# Patient Record
Sex: Female | Born: 1965
Health system: Southern US, Community
[De-identification: ages and names within clinical notes are randomized; demographics above are authoritative.]

## PROBLEM LIST (undated history)

## (undated) DIAGNOSIS — R221 Localized swelling, mass and lump, neck: Secondary | ICD-10-CM

## (undated) DIAGNOSIS — R220 Localized swelling, mass and lump, head: Principal | ICD-10-CM

## (undated) DIAGNOSIS — I10 Essential (primary) hypertension: Secondary | ICD-10-CM

## (undated) DIAGNOSIS — D219 Benign neoplasm of connective and other soft tissue, unspecified: Secondary | ICD-10-CM

## (undated) DIAGNOSIS — D509 Iron deficiency anemia, unspecified: Secondary | ICD-10-CM

## (undated) HISTORY — DX: Localized swelling, mass and lump, head: R22.0

## (undated) HISTORY — PX: WISDOM TOOTH EXTRACTION: SHX21

## (undated) HISTORY — DX: Iron deficiency anemia, unspecified: D50.9

## (undated) HISTORY — DX: Benign neoplasm of connective and other soft tissue, unspecified: D21.9

## (undated) HISTORY — PX: TUBAL LIGATION: SHX77

## (undated) HISTORY — DX: Essential (primary) hypertension: I10

---

## 1985-10-15 HISTORY — PX: OTHER SURGICAL HISTORY: SHX169

## 1999-01-27 ENCOUNTER — Other Ambulatory Visit: Admission: RE | Admit: 1999-01-27 | Discharge: 1999-01-27 | Payer: Self-pay | Admitting: Obstetrics & Gynecology

## 1999-08-10 ENCOUNTER — Encounter (INDEPENDENT_AMBULATORY_CARE_PROVIDER_SITE_OTHER): Payer: Self-pay

## 1999-08-10 ENCOUNTER — Encounter (INDEPENDENT_AMBULATORY_CARE_PROVIDER_SITE_OTHER): Payer: Self-pay | Admitting: Specialist

## 1999-08-10 ENCOUNTER — Inpatient Hospital Stay (HOSPITAL_COMMUNITY): Admission: RE | Admit: 1999-08-10 | Discharge: 1999-08-13 | Payer: Self-pay | Admitting: Obstetrics & Gynecology

## 2001-05-26 ENCOUNTER — Other Ambulatory Visit: Admission: RE | Admit: 2001-05-26 | Discharge: 2001-05-26 | Payer: Self-pay | Admitting: Obstetrics and Gynecology

## 2004-01-17 ENCOUNTER — Emergency Department (HOSPITAL_COMMUNITY): Admission: AD | Admit: 2004-01-17 | Discharge: 2004-01-17 | Payer: Self-pay | Admitting: Family Medicine

## 2004-02-05 ENCOUNTER — Ambulatory Visit (HOSPITAL_COMMUNITY): Admission: RE | Admit: 2004-02-05 | Discharge: 2004-02-05 | Payer: Self-pay | Admitting: Family Medicine

## 2004-08-03 ENCOUNTER — Encounter: Admission: RE | Admit: 2004-08-03 | Discharge: 2004-08-03 | Payer: Self-pay | Admitting: Occupational Medicine

## 2005-04-13 HISTORY — PX: TOTAL ABDOMINAL HYSTERECTOMY: SHX209

## 2007-12-25 ENCOUNTER — Ambulatory Visit (HOSPITAL_BASED_OUTPATIENT_CLINIC_OR_DEPARTMENT_OTHER): Admission: RE | Admit: 2007-12-25 | Discharge: 2007-12-25 | Payer: Self-pay | Admitting: Orthopedic Surgery

## 2010-11-05 ENCOUNTER — Encounter: Payer: Self-pay | Admitting: Obstetrics and Gynecology

## 2010-11-16 ENCOUNTER — Emergency Department (HOSPITAL_COMMUNITY)
Admission: EM | Admit: 2010-11-16 | Discharge: 2010-11-16 | Disposition: A | Discharge status: Home / Self Care | Payer: BC Managed Care – PPO | Attending: Emergency Medicine | Admitting: Emergency Medicine

## 2010-11-16 DIAGNOSIS — M542 Cervicalgia: Secondary | ICD-10-CM | POA: Insufficient documentation

## 2010-11-16 DIAGNOSIS — M545 Low back pain, unspecified: Secondary | ICD-10-CM | POA: Insufficient documentation

## 2010-11-16 DIAGNOSIS — R51 Headache: Secondary | ICD-10-CM | POA: Insufficient documentation

## 2011-02-27 NOTE — Op Note (Signed)
[name], [name]               ACCOUNT NO.:  [id number]   MEDICAL RECORD NO.:  [id number]          PATIENT TYPE:  AMB   LOCATION:  DSC                          FACILITY:  [facility]   PHYSICIAN:  Katy Fitch. Sypher, M.D. DATE OF BIRTH:  Mar 05, 1966   DATE OF PROCEDURE:  12/25/2007  DATE OF DISCHARGE:                               OPERATIVE REPORT   PREOPERATIVE DIAGNOSIS:  Laceration extensor tendon left ring finger  with 60 degrees extensor lag.   POSTOPERATIVE DIAGNOSIS:  Avulsion of extensor tendon from dorsal  tubercle of ring finger distal phalanx and intra-articular laceration of  distal interphalangeal joint, left ring finger.   OPERATION:  1. Irrigation and debridement of open left ring finger DIP joint.  2. A 0.035-inch Kirschner wire fixation of left ring finger DIP joint      in 10 degrees hyperextension.  3. Reconstruction of extensor mechanism utilizing 4-0 M[place]il[place]      grasping suture and 4-0 nylon stay sutures through dermis and      epidermis distally.   OPERATING SURGEON:  Josephine Igo, M.D.   ASSISTANT:  Annye Rusk, P.A.-C.   ANESTHESIA:  Is general by LMA; supervising anesthesiologist is Dr.  Krista Blue.   INDICATIONS:  [name] is a 41-year woman referred through the  courtesy of the urgent medical care center.  Two days prior, she had  lacerated the dorsum of her left ring finger with a carving knife.   She was seen at the [facility]gent [facility] where her wound was cleaned and  sutured.  She subsequently developed a 60-degree extensor lag.  She  return for evaluation on December 24, 2007 and was advised to seek a hand  surgery consult.   She contacted our office this morning and was seen on urgent basis at  the [facility] day surgery center for consultation.   At that time, she reported the injury and the development of her  extensor lag.   She had been on Keflex 5 mg 1 p.o. q.6h. as a prophylactic antibiotic.   Past medical history was reviewed in  detail.  She was noted be  intolerant of oxycodone (itching).   Her past medical history was reviewed.   HER DRUG ALLERGIES INCLUDED INTOLERANCE TO OXYCODONE.   She was on lisinopril for elevated blood pressure.   Her prior surgery history revealed a hysterectomy in 2000.  Her family  history was otherwise noncontributory.  She is accompanied by her  husband during our consult.   She reported that she had no history of significant pulmonary,  cardiovascular, neurological, hematologic, endocrine, GI, or GU  symptomatology.  Her reproductive history revealed that she was  postmenopausal status post hysterectomy.  She had no skin problems and  no sleep impairment.   We advised her that we would proceed with irrigation debridement of her  joint, K-wire fixation of her DIP joint and extension, followed by  repair of her extensor tendon.  She understood that the pin would need  to be in place for approximately six weeks.  She also understood that  she would be stiff for  a period time but should be able to recover the  majority of her DIP motion with therapy.   After informed consent, she was brought to the operating room at this  time.   PROCEDURE:  [name] was brought to the operating room and placed  in supine position on the operating table.   Following an anesthesia consult by Dr. Krista Blue, general anesthesia by LMA  technique was recommended and accepted.   She was brought to Room 1, placed in supine position on the operating  room table, and general anesthesia by LMA technique induced.   The left arm was then prepped with Betadine soap solution and sterilely  draped.  A pneumatic tourniquet was applied to the proximal left  brachium.  One gram of Ancef was administered as an IV prophylactic  antibiotic followed by exsanguination of the left arm with Esmarch  bandage and inflation of the arterial tourniquet to 220 mmHg.  The  procedure commenced with removal of the  sutures and irrigation of the  wound.  The PIP joint was violated.  The joint was thoroughly irrigated  with sterile saline.  The extensor tendon was mobilized.  The PIP joint  was placed in 10 degrees hyperextension, and a 0.035-inch Kirschner wire  was drilled across the distal phalanx and the phalangeal head, securing  the joint in this position.   K-wire position was confirmed by C-arm images.   The extensor was then repaired with a grasping box-style mattress suture  anatomically followed by reinforcement with transdermal stay sutures  brought out radially and ulnarly along the diaphysis of the distal  phalanx.  An anatomic repair to the tubercle was achieved.   The K-wire was prepared in the usual manner.  The skin was repaired with  trauma sutures of 4-0 nylon.  The finger was then dressed with Xeroflo  sterile gauze and Coban with Alumafoam splint.  [name] tolerated the  surgery and anesthesia well.   For aftercare, she was provided a prescription for Keflex 5 mg 1 p.o.  q.6h. p.r.n. pain that she will continue from the Urgent Care Center.  She will also use Vicodin 5 mg 1 p.o. q4-6h. p.r.n. pain.  She was  provided 20 tablets without refill.  We will see her back in follow-up  in one week to change her dressing, obtain an x-ray, and fashion a  splint to project her Kirschner wire construct.      Katy Fitch Sypher, M.D.  Electronically Signed     RVS/MEDQ  D:  12/25/2007  T:  12/26/2007  Job:  [id number]

## 2011-05-10 ENCOUNTER — Emergency Department (HOSPITAL_COMMUNITY)
Admission: EM | Admit: 2011-05-10 | Discharge: 2011-05-10 | Disposition: A | Discharge status: Home / Self Care | Payer: BC Managed Care – PPO | Attending: Emergency Medicine | Admitting: Emergency Medicine

## 2011-05-10 DIAGNOSIS — F3289 Other specified depressive episodes: Secondary | ICD-10-CM | POA: Insufficient documentation

## 2011-05-10 DIAGNOSIS — F329 Major depressive disorder, single episode, unspecified: Secondary | ICD-10-CM | POA: Insufficient documentation

## 2011-05-10 LAB — URINALYSIS, ROUTINE W REFLEX MICROSCOPIC
Bilirubin Urine: NEGATIVE
Glucose, UA: NEGATIVE mg/dL
Ketones, ur: NEGATIVE mg/dL
Leukocytes, UA: NEGATIVE
Nitrite: NEGATIVE
Protein, ur: 100 mg/dL — AB
Specific Gravity, Urine: 1.029 (ref 1.005–1.030)
Urobilinogen, UA: 0.2 mg/dL (ref 0.0–1.0)
pH: 5 (ref 5.0–8.0)

## 2011-05-10 LAB — DIFFERENTIAL
Basophils Absolute: 0 10*3/uL (ref 0.0–0.1)
Basophils Relative: 0 % (ref 0–1)
Eosinophils Absolute: 0.1 10*3/uL (ref 0.0–0.7)
Eosinophils Relative: 1 % (ref 0–5)
Lymphocytes Relative: 20 % (ref 12–46)
Lymphs Abs: 2.2 10*3/uL (ref 0.7–4.0)
Monocytes Absolute: 0.6 10*3/uL (ref 0.1–1.0)
Monocytes Relative: 6 % (ref 3–12)
Neutro Abs: 8.1 10*3/uL — ABNORMAL HIGH (ref 1.7–7.7)
Neutrophils Relative %: 73 % (ref 43–77)

## 2011-05-10 LAB — COMPREHENSIVE METABOLIC PANEL
ALT: 21 U/L (ref 0–35)
AST: 21 U/L (ref 0–37)
Albumin: 3.9 g/dL (ref 3.5–5.2)
Alkaline Phosphatase: 87 U/L (ref 39–117)
BUN: 12 mg/dL (ref 6–23)
CO2: 24 mEq/L (ref 19–32)
Calcium: 10.3 mg/dL (ref 8.4–10.5)
Chloride: 102 mEq/L (ref 96–112)
Creatinine, Ser: 1.03 mg/dL (ref 0.50–1.10)
GFR calc Af Amer: >60 mL/min (ref >60)
GFR calc non Af Amer: 58 mL/min — ABNORMAL LOW (ref >60)
Glucose, Bld: 108 mg/dL — ABNORMAL HIGH (ref 70–99)
Potassium: 3.3 mEq/L — ABNORMAL LOW (ref 3.5–5.1)
Sodium: 135 mEq/L (ref 135–145)
Total Bilirubin: 0.4 mg/dL (ref 0.3–1.2)
Total Protein: 8.5 g/dL — ABNORMAL HIGH (ref 6.0–8.3)

## 2011-05-10 LAB — CBC
HCT: 37 % (ref 36.0–46.0)
Hemoglobin: 12 g/dL (ref 12.0–15.0)
MCH: 27.2 pg (ref 26.0–34.0)
MCHC: 32.4 g/dL (ref 30.0–36.0)
MCV: 83.9 fL (ref 78.0–100.0)
Platelets: 378 10*3/uL (ref 150–400)
RBC: 4.41 MIL/uL (ref 3.87–5.11)
RDW: 13.8 % (ref 11.5–15.5)
WBC: 11.1 10*3/uL — ABNORMAL HIGH (ref 4.0–10.5)

## 2011-05-10 LAB — URINE MICROSCOPIC-ADD ON

## 2011-05-10 LAB — RAPID URINE DRUG SCREEN, HOSP PERFORMED
Amphetamines: NOT DETECTED
Barbiturates: NOT DETECTED
Benzodiazepines: NOT DETECTED
Cocaine: NOT DETECTED
Opiates: NOT DETECTED
Tetrahydrocannabinol: NOT DETECTED

## 2011-05-10 LAB — ETHANOL: Alcohol, Ethyl (B): <11 mg/dL (ref 0–11)

## 2011-05-15 ENCOUNTER — Other Ambulatory Visit (HOSPITAL_COMMUNITY): Payer: BC Managed Care – PPO | Attending: Psychiatry | Admitting: Psychiatry

## 2011-05-15 DIAGNOSIS — F3289 Other specified depressive episodes: Secondary | ICD-10-CM | POA: Insufficient documentation

## 2011-05-15 DIAGNOSIS — F329 Major depressive disorder, single episode, unspecified: Secondary | ICD-10-CM | POA: Insufficient documentation

## 2011-05-15 DIAGNOSIS — I10 Essential (primary) hypertension: Secondary | ICD-10-CM | POA: Insufficient documentation

## 2011-05-16 ENCOUNTER — Encounter (HOSPITAL_COMMUNITY): Payer: BC Managed Care – PPO | Attending: Psychiatry | Admitting: Psychiatry

## 2011-05-16 DIAGNOSIS — I10 Essential (primary) hypertension: Secondary | ICD-10-CM | POA: Insufficient documentation

## 2011-05-16 DIAGNOSIS — F329 Major depressive disorder, single episode, unspecified: Secondary | ICD-10-CM | POA: Insufficient documentation

## 2011-05-16 DIAGNOSIS — F3289 Other specified depressive episodes: Secondary | ICD-10-CM | POA: Insufficient documentation

## 2011-05-16 DIAGNOSIS — F4323 Adjustment disorder with mixed anxiety and depressed mood: Secondary | ICD-10-CM

## 2011-05-17 ENCOUNTER — Encounter (HOSPITAL_COMMUNITY): Payer: BC Managed Care – PPO | Admitting: Psychiatry

## 2011-05-18 ENCOUNTER — Encounter (HOSPITAL_COMMUNITY): Payer: BC Managed Care – PPO | Admitting: Psychiatry

## 2011-05-21 ENCOUNTER — Encounter (HOSPITAL_COMMUNITY): Payer: BC Managed Care – PPO | Admitting: Psychiatry

## 2011-05-22 ENCOUNTER — Encounter (HOSPITAL_COMMUNITY): Payer: BC Managed Care – PPO | Admitting: Psychiatry

## 2011-05-23 ENCOUNTER — Encounter (HOSPITAL_COMMUNITY): Payer: BC Managed Care – PPO | Admitting: Psychiatry

## 2011-05-24 ENCOUNTER — Encounter (HOSPITAL_COMMUNITY): Payer: BC Managed Care – PPO | Admitting: Psychiatry

## 2011-05-25 ENCOUNTER — Encounter (HOSPITAL_COMMUNITY): Payer: BC Managed Care – PPO | Admitting: Psychiatry

## 2011-05-28 ENCOUNTER — Encounter (HOSPITAL_COMMUNITY): Payer: BC Managed Care – PPO | Admitting: Psychiatry

## 2011-05-29 ENCOUNTER — Encounter (HOSPITAL_COMMUNITY): Payer: BC Managed Care – PPO | Admitting: Psychiatry

## 2011-05-30 ENCOUNTER — Encounter (HOSPITAL_COMMUNITY): Payer: BC Managed Care – PPO | Admitting: Psychiatry

## 2011-05-31 ENCOUNTER — Encounter (HOSPITAL_COMMUNITY): Payer: BC Managed Care – PPO | Admitting: Psychiatry

## 2011-06-01 ENCOUNTER — Encounter (HOSPITAL_COMMUNITY): Payer: BC Managed Care – PPO | Admitting: Psychiatry

## 2011-06-04 ENCOUNTER — Encounter (HOSPITAL_COMMUNITY): Payer: BC Managed Care – PPO | Admitting: Psychiatry

## 2011-06-05 ENCOUNTER — Encounter (HOSPITAL_COMMUNITY): Payer: BC Managed Care – PPO | Admitting: Psychiatry

## 2011-06-06 ENCOUNTER — Encounter (HOSPITAL_COMMUNITY): Payer: BC Managed Care – PPO | Admitting: Psychiatry

## 2011-06-07 ENCOUNTER — Encounter (HOSPITAL_COMMUNITY): Payer: BC Managed Care – PPO | Admitting: Psychiatry

## 2011-06-08 ENCOUNTER — Encounter (HOSPITAL_COMMUNITY): Payer: BC Managed Care – PPO | Admitting: Psychiatry

## 2011-06-11 ENCOUNTER — Encounter (HOSPITAL_COMMUNITY): Payer: BC Managed Care – PPO | Admitting: Psychiatry

## 2011-06-12 ENCOUNTER — Encounter (HOSPITAL_COMMUNITY): Payer: BC Managed Care – PPO | Admitting: Psychiatry

## 2011-06-12 ENCOUNTER — Ambulatory Visit (HOSPITAL_COMMUNITY): Payer: BC Managed Care – PPO | Admitting: Physician Assistant

## 2011-07-09 LAB — POCT I-STAT, CHEM 8
BUN: 9
Calcium, Ion: 1.13
Chloride: 105
Creatinine, Ser: 0.9
Glucose, Bld: 99
HCT: 40
Hemoglobin: 13.6
Potassium: 4
Sodium: 141
TCO2: 24

## 2012-02-19 ENCOUNTER — Ambulatory Visit (INDEPENDENT_AMBULATORY_CARE_PROVIDER_SITE_OTHER): Payer: BC Managed Care – PPO | Admitting: General Surgery

## 2012-02-21 ENCOUNTER — Encounter (INDEPENDENT_AMBULATORY_CARE_PROVIDER_SITE_OTHER): Payer: Self-pay | Admitting: Surgery

## 2012-02-25 ENCOUNTER — Encounter (INDEPENDENT_AMBULATORY_CARE_PROVIDER_SITE_OTHER): Payer: Self-pay | Admitting: Surgery

## 2012-02-25 ENCOUNTER — Ambulatory Visit (INDEPENDENT_AMBULATORY_CARE_PROVIDER_SITE_OTHER): Payer: BC Managed Care – PPO | Admitting: Surgery

## 2012-02-25 VITALS — BP 132/98 | HR 68 | Temp 97.5°F | Ht 61.0 in | Wt 162.0 lb

## 2012-02-25 DIAGNOSIS — R220 Localized swelling, mass and lump, head: Secondary | ICD-10-CM | POA: Insufficient documentation

## 2012-02-25 DIAGNOSIS — R229 Localized swelling, mass and lump, unspecified: Secondary | ICD-10-CM

## 2012-02-25 HISTORY — DX: Localized swelling, mass and lump, head: R22.0

## 2012-02-25 NOTE — Progress Notes (Signed)
Patient ID: [name], female   DOB: 09-12-1966, 46 y.o.   MRN: [id number]  Chief Complaint  Patient presents with  . Other    scalp mass    HPI [name] is a 46 y.o. female. This is a very pleasant female referred by Dr. Knox Royalty [facility] evaluation of a posterior scalp mass. The patient has noticed a mass for approximately a year. It is now getting larger, causing her to have tension headaches, and posterior have neck and shoulder pain. The pain is described as an ache. It is getting worse. She is otherwise without complaints HPI  Past Medical History  Diagnosis Date  . Hypertension   . Hyperlipidemia     Past Surgical History  Procedure Date  . Cervix cerclage 1987    Family History  Problem Relation Age of Onset  . Heart disease Father     Social History History  Substance Use Topics  . Smoking status: Current Some Day Smoker    Types: Cigars  . Smokeless tobacco: Not on file  . Alcohol Use: No    Allergies  Allergen Reactions  . Tylox (Oxycodone-Acetaminophen)     Current Outpatient Prescriptions  Medication Sig Dispense Refill  . benzonatate (TESSALON) 200 MG capsule       . lisinopril-hydrochlorothiazide (PRINZIDE,ZESTORETIC) 20-12.5 MG per tablet         Review of Systems Review of Systems  Constitutional: Negative for fever, chills and unexpected weight change.  HENT: Positive for neck stiffness. Negative for hearing loss, congestion, sore throat, trouble swallowing and voice change.   Eyes: Negative for visual disturbance.  Respiratory: Negative for cough and wheezing.   Cardiovascular: Negative for chest pain, palpitations and leg swelling.  Gastrointestinal: Negative for nausea, vomiting, abdominal pain, diarrhea, constipation, blood in stool, abdominal distention and anal bleeding.  Genitourinary: Negative for hematuria, vaginal bleeding and difficulty urinating.  Musculoskeletal: Negative for  arthralgias.  Skin: Negative for rash and wound.  Neurological: Positive for headaches. Negative for seizures and syncope.  Hematological: Negative for adenopathy. Does not bruise/bleed easily.  Psychiatric/Behavioral: Negative for confusion.    Blood pressure 132/98, pulse 68, temperature 97.5 F (36.4 C), temperature source Temporal, height 5\' 1"  (1.549 m), weight 162 lb (73.483 kg), SpO2 98.00%.  Physical Exam Physical Exam  Constitutional: She is oriented to person, place, and time. She appears well-developed and well-nourished. No distress.  HENT:  Head: Normocephalic and atraumatic.  Right Ear: External ear normal.  Left Ear: External ear normal.  Nose: Nose normal.  Mouth/Throat: Oropharynx is clear and moist. No oropharyngeal exudate.       There is a 3 cm mass posteriorly at the base of her scalp. It is soft and minimally mobile. It is mildly tender. There is no erythema  Eyes: Conjunctivae and EOM are normal. Pupils are equal, round, and reactive to light. No scleral icterus.  Neck: Normal range of motion. Neck supple. No tracheal deviation present. No thyromegaly present.  Cardiovascular: Normal rate, regular rhythm, normal heart sounds and intact distal pulses.   No murmur heard. Pulmonary/Chest: Effort normal and breath sounds normal. No respiratory distress. She has no wheezes.  Musculoskeletal: Normal range of motion. She exhibits no edema and no tenderness.  Lymphadenopathy:    She has no cervical adenopathy.  Neurological: She is alert and oriented to person, place, and time.  Skin: Skin is warm and dry. She is not diaphoretic. No erythema. No pallor.  Psychiatric: Her behavior is normal. Judgment normal.    Data Reviewed   Assessment    Posterior scalp mass of uncertain etiology    Plan    This may represent a lipoma. Removal of it is recommended given her symptoms and for histologic evaluation to rule out malignancy. I discussed this with her in detail. I  discussed the risk of surgery which includes but is not limited to bleeding, infection, recurrence, need for further surgery, seroma formation, etc. She understands and wishes to proceed. Surgery will be scheduled. Likelihood of success is good       Mercedes Valeriano A 02/25/2012, 1:42 PM

## 2012-03-31 ENCOUNTER — Encounter (INDEPENDENT_AMBULATORY_CARE_PROVIDER_SITE_OTHER): Payer: BC Managed Care – PPO | Admitting: Surgery

## 2012-12-23 ENCOUNTER — Ambulatory Visit: Payer: Private Health Insurance - Indemnity | Admitting: Obstetrics and Gynecology

## 2012-12-23 ENCOUNTER — Encounter: Payer: Self-pay | Admitting: Obstetrics and Gynecology

## 2012-12-23 VITALS — BP 142/74 | Ht 62.0 in | Wt 149.0 lb

## 2012-12-23 DIAGNOSIS — A749 Chlamydial infection, unspecified: Secondary | ICD-10-CM | POA: Insufficient documentation

## 2012-12-23 DIAGNOSIS — L309 Dermatitis, unspecified: Secondary | ICD-10-CM

## 2012-12-23 DIAGNOSIS — Z01419 Encounter for gynecological examination (general) (routine) without abnormal findings: Secondary | ICD-10-CM

## 2012-12-23 MED ORDER — NYSTATIN-TRIAMCINOLONE 100000-0.1 UNIT/GM-% EX OINT: TOPICAL_OINTMENT | Freq: Three times a day (TID) | CUTANEOUS | Status: DC | PRN

## 2012-12-23 NOTE — Progress Notes (Signed)
The patient is not taking hormone replacement therapy The patient  is not taking a Calcium supplement. Post-menopausal bleeding:no  Last Pap: approximate date 12/2011 and was normal  Last mammogram: approximate date 2008 and was normal  Last DEXA scan : n/a Last colonoscopy:n/a  Urinary symptoms: none Normal bowel movements: Yes Reports abuse at home: No:   Subjective:    [name] is a 47 y.o. female G4P3, who presents for annual exam.  The patient complains of rash at vaginal area after shaving, worsens when sweating. Pt has used vasoline which has not helped.  The following portions of the patient's history were reviewed and updated as appropriate: allergies, current medications, past family history, past medical history, past social history, past surgical history and problem list.  Review of Systems Pertinent items are noted in HPI. Gastrointestinal:No change in bowel habits, no abdominal pain, no rectal bleeding Genitourinary:negative for dysuria, frequency, hematuria, nocturia and urinary incontinence    Objective:     BP 142/74  Ht 5\' 2"  (1.575 m)  Wt 149 lb (67.586 kg)  BMI 27.25 kg/m2  Wt Readings from Last 1 Encounters:  02/25/12 162 lb (73.483 kg)     BMI: Body mass index is 27.25 kg/(m^2). General Appearance: Alert, appropriate appearance for age. No acute distress HEENT: Grossly normal Neck / Thyroid: Supple, no masses, nodes or enlargement Lungs: clear to auscultation bilaterally Back: No CVA tenderness Breast Exam: No masses or nodes.No dimpling, nipple retraction or discharge. Cardiovascular: Regular rate and rhythm. S1, S2, no murmur Gastrointestinal: Soft, non-tender, no masses or organomegaly Pelvic Exam: Vulva with erythema c/w eczema and vagina appear normal. Bimanual exam reveals surgically absent uterus and adnexa. Rectovaginal: normal rectal, no masses Lymphatic Exam: Non-palpable nodes in neck, clavicular, axillary, or inguinal regions   Skin: no rash or abnormalities Neurologic: Normal gait and speech, no tremor  Psychiatric: Alert and oriented, appropriate affect.   Assessment:    Normal gyn exam  Vulvar eczema   Plan:   mammogram return annually or prn Schedule [facility] @ [facility]  Triamcinolone cream sent to pharmacy  Silverio Lay MD

## 2013-02-25 ENCOUNTER — Emergency Department (HOSPITAL_COMMUNITY): Payer: Managed Care, Other (non HMO)

## 2013-02-25 ENCOUNTER — Encounter (HOSPITAL_COMMUNITY): Payer: Self-pay

## 2013-02-25 ENCOUNTER — Emergency Department (HOSPITAL_COMMUNITY)
Admission: EM | Admit: 2013-02-25 | Discharge: 2013-02-25 | Disposition: A | Discharge status: Home / Self Care | Payer: Managed Care, Other (non HMO) | Attending: Emergency Medicine | Admitting: Emergency Medicine

## 2013-02-25 DIAGNOSIS — F17200 Nicotine dependence, unspecified, uncomplicated: Secondary | ICD-10-CM | POA: Insufficient documentation

## 2013-02-25 DIAGNOSIS — Z8679 Personal history of other diseases of the circulatory system: Secondary | ICD-10-CM | POA: Insufficient documentation

## 2013-02-25 DIAGNOSIS — I10 Essential (primary) hypertension: Secondary | ICD-10-CM | POA: Insufficient documentation

## 2013-02-25 DIAGNOSIS — Y9389 Activity, other specified: Secondary | ICD-10-CM | POA: Insufficient documentation

## 2013-02-25 DIAGNOSIS — Z862 Personal history of diseases of the blood and blood-forming organs and certain disorders involving the immune mechanism: Secondary | ICD-10-CM | POA: Insufficient documentation

## 2013-02-25 DIAGNOSIS — Z8619 Personal history of other infectious and parasitic diseases: Secondary | ICD-10-CM | POA: Insufficient documentation

## 2013-02-25 DIAGNOSIS — Z8742 Personal history of other diseases of the female genital tract: Secondary | ICD-10-CM | POA: Insufficient documentation

## 2013-02-25 DIAGNOSIS — E785 Hyperlipidemia, unspecified: Secondary | ICD-10-CM | POA: Insufficient documentation

## 2013-02-25 DIAGNOSIS — S060X9A Concussion with loss of consciousness of unspecified duration, initial encounter: Secondary | ICD-10-CM

## 2013-02-25 DIAGNOSIS — Y92410 Unspecified street and highway as the place of occurrence of the external cause: Secondary | ICD-10-CM | POA: Insufficient documentation

## 2013-02-25 DIAGNOSIS — R221 Localized swelling, mass and lump, neck: Secondary | ICD-10-CM | POA: Insufficient documentation

## 2013-02-25 DIAGNOSIS — R109 Unspecified abdominal pain: Secondary | ICD-10-CM | POA: Insufficient documentation

## 2013-02-25 DIAGNOSIS — Z79899 Other long term (current) drug therapy: Secondary | ICD-10-CM | POA: Insufficient documentation

## 2013-02-25 DIAGNOSIS — R220 Localized swelling, mass and lump, head: Secondary | ICD-10-CM | POA: Insufficient documentation

## 2013-02-25 LAB — CBC
HCT: 37.6 % (ref 36.0–46.0)
Hemoglobin: 12.4 g/dL (ref 12.0–15.0)
RBC: 4.47 MIL/uL (ref 3.87–5.11)

## 2013-02-25 LAB — POCT I-STAT, CHEM 8
BUN: 11 mg/dL (ref 6–23)
Calcium, Ion: 1.14 mmol/L (ref 1.12–1.23)
HCT: 41 % (ref 36.0–46.0)
Hemoglobin: 13.9 g/dL (ref 12.0–15.0)
TCO2: 23 mmol/L (ref 0–100)

## 2013-02-25 LAB — COMPREHENSIVE METABOLIC PANEL
ALT: 13 U/L (ref 0–35)
Alkaline Phosphatase: 72 U/L (ref 39–117)
BUN: 11 mg/dL (ref 6–23)
CO2: 23 mEq/L (ref 19–32)
Chloride: 102 mEq/L (ref 96–112)
GFR calc Af Amer: >90 mL/min (ref >90)
GFR calc non Af Amer: 82 mL/min — ABNORMAL LOW (ref >90)
Glucose, Bld: 99 mg/dL (ref 70–99)
Potassium: 3.8 mEq/L (ref 3.5–5.1)
Sodium: 134 mEq/L — ABNORMAL LOW (ref 135–145)
Total Bilirubin: 0.5 mg/dL (ref 0.3–1.2)

## 2013-02-25 MED ORDER — KETOROLAC TROMETHAMINE 30 MG/ML IJ SOLN
30.0000 mg | Freq: Once | INTRAMUSCULAR | Status: AC
  Administered 2013-02-25: 30 mg via INTRAVENOUS
  Filled 2013-02-25: qty 1

## 2013-02-25 MED ORDER — FENTANYL CITRATE 0.05 MG/ML IJ SOLN
50.0000 ug | Freq: Once | INTRAMUSCULAR | Status: AC
  Administered 2013-02-25: 50 ug via INTRAVENOUS
  Filled 2013-02-25 (×2): qty 2

## 2013-02-25 MED ORDER — OXYCODONE-ACETAMINOPHEN 5-325 MG PO TABS: 2.0000 | ORAL_TABLET | ORAL | Status: DC | PRN

## 2013-02-25 MED ORDER — SODIUM CHLORIDE 0.9 % IV SOLN
INTRAVENOUS | Status: DC
  Administered 2013-02-25: 05:00:00 via INTRAVENOUS

## 2013-02-25 MED ORDER — CYCLOBENZAPRINE HCL 10 MG PO TABS: 10.0000 mg | ORAL_TABLET | Freq: Two times a day (BID) | ORAL | Status: DC | PRN

## 2013-02-25 MED ORDER — IBUPROFEN 800 MG PO TABS: 800.0000 mg | ORAL_TABLET | Freq: Three times a day (TID) | ORAL | Status: DC

## 2013-02-25 MED ORDER — DIPHENHYDRAMINE HCL 25 MG PO CAPS: 25.0000 mg | ORAL_CAPSULE | Freq: Four times a day (QID) | ORAL | Status: DC | PRN

## 2013-02-25 MED ORDER — ONDANSETRON HCL 4 MG/2ML IJ SOLN
4.0000 mg | Freq: Once | INTRAMUSCULAR | Status: AC
  Administered 2013-02-25: 4 mg via INTRAVENOUS
  Filled 2013-02-25: qty 2

## 2013-02-25 MED ORDER — IOHEXOL 300 MG/ML  SOLN
100.0000 mL | Freq: Once | INTRAMUSCULAR | Status: AC | PRN
  Administered 2013-02-25: 100 mL via INTRAVENOUS
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
## 2013-12-30 DIAGNOSIS — I10 Essential (primary) hypertension: Secondary | ICD-10-CM | POA: Insufficient documentation

## 2014-05-30 ENCOUNTER — Encounter (HOSPITAL_COMMUNITY): Payer: Self-pay | Admitting: Emergency Medicine

## 2014-05-30 ENCOUNTER — Emergency Department (HOSPITAL_COMMUNITY)
Admission: EM | Admit: 2014-05-30 | Discharge: 2014-05-30 | Disposition: A | Discharge status: Home / Self Care | Payer: BC Managed Care – PPO | Attending: Emergency Medicine | Admitting: Emergency Medicine

## 2014-05-30 DIAGNOSIS — Z90710 Acquired absence of both cervix and uterus: Secondary | ICD-10-CM | POA: Diagnosis not present

## 2014-05-30 DIAGNOSIS — Z862 Personal history of diseases of the blood and blood-forming organs and certain disorders involving the immune mechanism: Secondary | ICD-10-CM | POA: Insufficient documentation

## 2014-05-30 DIAGNOSIS — M545 Low back pain, unspecified: Secondary | ICD-10-CM | POA: Insufficient documentation

## 2014-05-30 DIAGNOSIS — B9689 Other specified bacterial agents as the cause of diseases classified elsewhere: Secondary | ICD-10-CM | POA: Diagnosis not present

## 2014-05-30 DIAGNOSIS — Y929 Unspecified place or not applicable: Secondary | ICD-10-CM | POA: Diagnosis not present

## 2014-05-30 DIAGNOSIS — Z9851 Tubal ligation status: Secondary | ICD-10-CM | POA: Insufficient documentation

## 2014-05-30 DIAGNOSIS — Z8639 Personal history of other endocrine, nutritional and metabolic disease: Secondary | ICD-10-CM | POA: Insufficient documentation

## 2014-05-30 DIAGNOSIS — Z8669 Personal history of other diseases of the nervous system and sense organs: Secondary | ICD-10-CM | POA: Diagnosis not present

## 2014-05-30 DIAGNOSIS — Z791 Long term (current) use of non-steroidal anti-inflammatories (NSAID): Secondary | ICD-10-CM | POA: Insufficient documentation

## 2014-05-30 DIAGNOSIS — F17200 Nicotine dependence, unspecified, uncomplicated: Secondary | ICD-10-CM | POA: Diagnosis not present

## 2014-05-30 DIAGNOSIS — Z79899 Other long term (current) drug therapy: Secondary | ICD-10-CM | POA: Insufficient documentation

## 2014-05-30 DIAGNOSIS — I10 Essential (primary) hypertension: Secondary | ICD-10-CM | POA: Insufficient documentation

## 2014-05-30 DIAGNOSIS — A499 Bacterial infection, unspecified: Secondary | ICD-10-CM | POA: Insufficient documentation

## 2014-05-30 DIAGNOSIS — T192XXA Foreign body in vulva and vagina, initial encounter: Secondary | ICD-10-CM | POA: Insufficient documentation

## 2014-05-30 DIAGNOSIS — IMO0002 Reserved for concepts with insufficient information to code with codable children: Secondary | ICD-10-CM | POA: Diagnosis not present

## 2014-05-30 DIAGNOSIS — N760 Acute vaginitis: Secondary | ICD-10-CM | POA: Insufficient documentation

## 2014-05-30 DIAGNOSIS — Y9389 Activity, other specified: Secondary | ICD-10-CM | POA: Diagnosis not present

## 2014-05-30 LAB — WET PREP, GENITAL
TRICH WET PREP: NONE SEEN
Yeast Wet Prep HPF POC: NONE SEEN

## 2014-05-30 LAB — RPR

## 2014-05-30 LAB — HIV ANTIBODY (ROUTINE TESTING W REFLEX): HIV: NONREACTIVE

## 2014-05-30 MED ORDER — METRONIDAZOLE 500 MG PO TABS: 500.0000 mg | ORAL_TABLET | Freq: Two times a day (BID) | ORAL | Status: DC

## 2014-05-30 MED ORDER — IBUPROFEN 200 MG PO TABS
600.0000 mg | ORAL_TABLET | Freq: Once | ORAL | Status: AC
  Administered 2014-05-30: 600 mg via ORAL
  Filled 2014-05-30: qty 3

## 2014-05-30 NOTE — Discharge Instructions (Signed)
Your vaginal swab showed a bacterial infection, therefore you were given Flagyl. Take this as directed, and do not drink alcohol while taking this medication. You were tested for STDs, if your labs are positive you will be called for treatment but if they're negative you will not hear from [place]. Use tylenol/motrin for pain. Avoid vaginal penetration for 2 days to allow your vaginal mucosa to heal. Return to the women's clinic for any further STD or gynecologic concerns. Return to the ER or women's ER as needed for changes or worsening symptoms.   Bacterial Vaginosis Bacterial vaginosis is an infection of the vagina. It happens when too many of certain germs (bacteria) grow in the vagina. HOME CARE  Take your medicine as told by your doctor.  Finish your medicine even if you start to feel better.  Do not have sex until you finish your medicine and are better.  Tell your sex partner that you have an infection. They should see their doctor for treatment.  Practice safe sex. Use condoms. Have only one sex partner. GET HELP IF:  You are not getting better after 3 days of treatment.  You have more grey fluid (discharge) coming from your vagina than before.  You have more pain than before.  You have a fever. MAKE SURE YOU:   Understand these instructions.  Will watch your condition.  Will get help right away if you are not doing well or get worse. Document Released: 07/10/2008 Document Revised: 07/22/2013 Document Reviewed: 05/13/2013 [facility] Patient Information 2015 [place], [place]. This information is not intended to replace advice given to you by your health care provider. Make sure you discuss any questions you have with your health care provider.  Vaginal Foreign Body A vaginal foreign body is any object that gets stuck or left inside the vagina. This can cause:  Bleeding.  Itching.  Pain.  Swelling.  Rash. In most cases, symptoms go away once the object is found and taken  out. Rarely, an object can break through the walls of the vagina and cause a serious infection. HOME CARE  Take all medicines as told by your doctor.  If you were given an antibiotic medicine, finish it all even if you start to feel better.  Do not have sex or use tampons until your doctor says it is okay.  Do not clean the vagina with a jet of water (douche) unless told by your doctor.  Keep all follow-up visits as told by your doctor. This is important. GET HELP IF:  You have a fever.  You have belly (abdominal) pain.  You have pain when you pee (urinate). GET HELP RIGHT AWAY IF:   You have very bad belly pain.  You have heavy bleeding or fluid coming from the vagina. MAKE SURE YOU:  Understand these instructions.  Will watch your condition.  Will get help right away if you are not doing well or get worse. Document Released: 09/19/2009 Document Revised: 02/15/2014 Document Reviewed: 07/31/2013 [facility] Patient Information 2015 [place], [place]. This information is not intended to replace advice given to you by your health care provider. Make sure you discuss any questions you have with your health care provider.

## 2014-05-30 NOTE — ED Provider Notes (Signed)
CSN: [id number]     Arrival date & time 05/30/14  1124 History   First MD Initiated Contact with Patient 05/30/14 1220     No chief complaint on file.    (Consider location/radiation/quality/duration/timing/severity/associated sxs/prior Treatment) HPI Comments: [name] is a 48 y.o. Female with a PMHx of HTN, HLD, and uterine fibroids, who presents today with complaints of a condom stuck in her vagina since 5:30 am, along with vaginal bleeding that occurred after trying to retreive the condom using her fingers. Pt states that she had sexual intercourse for the first time in 2 years, last night, and used a condom which got stuck. States she tried to grab it and had scant vaginal bleeding, bright red, less than menses, and some abd discomfort with back pain. States the pain is 5/10, crampy, constant, nonradiating, with no known aggravating or alleviating factors has not tried anything for the pain. Denies vaginal discharge, urinary complaints, fevers, chills, n/v/d/c, or dyspareunia. Denies dizziness, syncope, lightheadedness, or vertigo.  Patient is a 48 y.o. female presenting with vaginal bleeding. The history is provided by the patient. No language interpreter was used.  Vaginal Bleeding Quality:  Bright red Severity:  Mild Onset quality:  Sudden Duration:  6 hours Timing:  Rare Progression:  Unchanged Chronicity:  New Number of pads used:  0 Number of tampons used:  0 Possible pregnancy: no   Context: genital trauma (attempting to retreive condom from vagina)   Relieved by:  Nothing Worsened by:  Nothing tried Ineffective treatments:  None tried Associated symptoms: abdominal pain (lower abd pain, crampy) and back pain (lower back/buttocks)   Associated symptoms: no dizziness, no dyspareunia, no dysuria, no fatigue, no fever, no nausea and no vaginal discharge   Risk factors: no PID, no STD, no STD exposure and does not have unprotected sex     Past Medical History  Diagnosis  Date  . Hypertension   . Hyperlipidemia   . Scalp mass 02/25/2012  . Yeast infection   . Bacterial infection   . Chlamydia   . Anemia   . Migraine   . Fibroids    Past Surgical History  Procedure Laterality Date  . Cervix cerclage  1987  . Tubal ligation    . Wisdom tooth extraction    . Total abdominal hysterectomy     Family History  Problem Relation Age of Onset  . Heart disease Father   . Hypertension Father   . Stroke Father   . Hypertension Mother   . Hypertension Brother   . Asthma Sister   . Emphysema Sister   . Lung cancer Sister   . Sickle cell trait Daughter    History  Substance Use Topics  . Smoking status: Current Some Day Smoker    Types: Cigars  . Smokeless tobacco: Never Used  . Alcohol Use: No   OB History   Grav Para Term Preterm Abortions TAB SAB Ect Mult Living   4 3   1  1   3      Review of Systems  Constitutional: Negative for fever, chills and fatigue.  Respiratory: Negative for shortness of breath.   Cardiovascular: Negative for chest pain.  Gastrointestinal: Positive for abdominal pain (lower abd pain, crampy). Negative for nausea, vomiting, diarrhea, constipation, blood in stool, abdominal distention and rectal pain.  Genitourinary: Positive for vaginal bleeding, vaginal pain and pelvic pain. Negative for dysuria, urgency, frequency, hematuria, flank pain, decreased urine volume, vaginal discharge, difficulty urinating and dyspareunia.  Musculoskeletal: Positive for back pain (lower back/buttocks). Negative for arthralgias and myalgias.  Skin: Negative for color change.  Neurological: Negative for dizziness, syncope, weakness and headaches.  10 Systems reviewed and are negative for acute change except as noted in the HPI.     Allergies  Tylox  Home Medications   Prior to Admission medications   Medication Sig Start Date End Date Taking? Authorizing Provider  ibuprofen (ADVIL,MOTRIN) 800 MG tablet Take 1 tablet (800 mg total) by  mouth 3 (three) times daily. 02/25/13  Yes Teressa Lower, MD  metroNIDAZOLE (FLAGYL) 500 MG tablet Take 1 tablet (500 mg total) by mouth 2 (two) times daily. One po bid x 7 days 05/30/14   Patty Sermons Camprubi-Soms, PA-C   BP 173/94  Pulse 73  Temp(Src) 98.2 F (36.8 C) (Oral)  Resp 17  SpO2 100% Physical Exam  Nursing note and vitals reviewed. Constitutional: She is oriented to person, place, and time. Vital signs are normal. She appears well-developed and well-nourished. No distress.  Afebrile, NAD  HENT:  Head: Normocephalic and atraumatic.  Mouth/Throat: Mucous membranes are normal.  Eyes: Conjunctivae and EOM are normal. Right eye exhibits no discharge. Left eye exhibits no discharge.  Neck: Normal range of motion. Neck supple.  Cardiovascular: Normal rate and intact distal pulses.   Pulmonary/Chest: Effort normal and breath sounds normal. No respiratory distress.  Abdominal: Soft. Normal appearance and bowel sounds are normal. She exhibits no distension. There is tenderness in the right lower quadrant, suprapubic area and left lower quadrant. There is no rigidity, no rebound, no guarding, no CVA tenderness, no tenderness at McBurney's point and negative Murphy's sign.    Soft, nondistended, +BS throughout, with TTP along suprapubic and lower abd quadrants bilaterally near pelvic brim. No r/g/r, neg murphy's, neg mcburney's, neg psoas sign. No CVA TTP  Genitourinary: Uterus normal. Pelvic exam was performed with patient supine. There is no rash, tenderness or lesion on the right labia. There is no rash, tenderness or lesion on the left labia. Cervix exhibits no motion tenderness, no discharge and no friability. Right adnexum displays no mass, no tenderness and no fullness. Left adnexum displays no mass, no tenderness and no fullness. There is erythema and bleeding around the vagina. No tenderness around the vagina. There is a foreign body around the vagina. No signs of injury around the  vagina. No vaginal discharge found.  No rashes, lesions, or tenderness to external genitalia. Small area of erythema with small abrasion of vaginal mucosa along anterior wall with scant bright red blood noted to this area, contained and not actively bleeding into vaginal vault. No tenderness to vaginal mucosa. No vaginal discharge. No adnexal masses, tenderness, or fullness. No CMT, cervical friability, or discharge from cervical os. Cervical mass noted at os, which pt states is from cervical cerclage in 1987. Uterus non-deviated, mobile, nonTTP, and without enlargement. Condom located within vaginal vault distal to cervix, removed with ease  Musculoskeletal: Normal range of motion.  Lymphadenopathy:       Right: No inguinal adenopathy present.       Left: No inguinal adenopathy present.  Neurological: She is alert and oriented to person, place, and time.  Skin: Skin is warm, dry and intact. No rash noted.  Psychiatric: She has a normal mood and affect.    ED Course  Pelvic exam Date/Time: 05/30/2014 2:02 PM Performed by: CAMPRUBI-SOMS, Emmelyn Schmale STRUPP Authorized by: Corine Shelter Consent: Verbal consent obtained. Risks and benefits: risks, benefits and alternatives were  discussed Consent given by: patient Patient understanding: patient states understanding of the procedure being performed Patient consent: the patient's understanding of the procedure matches consent given Patient identity confirmed: verbally with patient Local anesthesia used: no Patient sedated: no Patient tolerance: Patient tolerated the procedure well with no immediate complications. Comments: Condom removed from vaginal vault using ringed forceps. Swabs obtained. No vaginal discharge noted.   (including critical care time) Labs Review Labs Reviewed  WET PREP, GENITAL - Abnormal; Notable for the following:    Clue Cells Wet Prep HPF POC FEW (*)    WBC, Wet Prep HPF POC FEW (*)    All other  components within normal limits  GC/CHLAMYDIA PROBE AMP  RPR  HIV ANTIBODY (ROUTINE TESTING)    Imaging Review No results found.   EKG Interpretation None      MDM   Final diagnoses:  Vaginal foreign body, initial encounter  BV (bacterial vaginosis)    47y/o with condom in vagina since 5:30am. Removed with ringed forceps. Pt wanting STD testing, will await wet prep but no vaginal discharge or concerning findings today therefore will not tx empirically for GC/CT. Will await results and discussed that hospital will call her if they're positive. Pt given ibuprofen 600mg  prior to pelvic exam. Wet prep shows BV, will tx with metro x7d. Discussed vaginal rest x2 days and ibuprofen/tylenol for pain. Will have pt f/up with women's outpt clinic as needed for ongoing gyn care. I explained the diagnosis and have given explicit precautions to return to the ER including for any other new or worsening symptoms. The patient understands and accepts the medical plan as it's been dictated and I have answered their questions. Discharge instructions concerning home care and prescriptions have been given. The patient is STABLE and is discharged to home in good condition.  BP 173/94  Pulse 73  Temp(Src) 98.2 F (36.8 C) (Oral)  Resp 17  SpO2 100%  Meds ordered this encounter  Medications  . ibuprofen (ADVIL,MOTRIN) tablet 600 mg    Sig:   . metroNIDAZOLE (FLAGYL) 500 MG tablet    Sig: Take 1 tablet (500 mg total) by mouth 2 (two) times daily. One po bid x 7 days    Dispense:  14 tablet    Refill:  0    Order Specific Question:  Supervising Provider    Answer:  Noemi Chapel D [[id number]]     Patty Sermons Camprubi-Soms, PA-C 05/30/14 1439

## 2014-05-30 NOTE — ED Notes (Signed)
Pt states that she had sex around 5am this morning and had condom to come off and get stuck in her vagina. Pt states that she has been trying to get it out but now having vaginal bleeding but minimal, denies saturating pad.

## 2014-05-30 NOTE — ED Provider Notes (Signed)
Medical screening examination/treatment/procedure(s) were performed by non-physician practitioner and as supervising physician I was immediately available for consultation/collaboration.  Richarda Blade, MD 05/30/14 [phone]

## 2014-06-01 LAB — GC/CHLAMYDIA PROBE AMP
CT PROBE, AMP APTIMA: NEGATIVE
GC PROBE AMP APTIMA: NEGATIVE

## 2014-08-16 ENCOUNTER — Encounter (HOSPITAL_COMMUNITY): Payer: Self-pay | Admitting: Emergency Medicine

## 2015-06-11 ENCOUNTER — Emergency Department (HOSPITAL_COMMUNITY): Payer: BLUE CROSS/BLUE SHIELD

## 2015-06-11 ENCOUNTER — Encounter (HOSPITAL_COMMUNITY): Payer: Self-pay | Admitting: Emergency Medicine

## 2015-06-11 ENCOUNTER — Emergency Department (HOSPITAL_COMMUNITY)
Admission: EM | Admit: 2015-06-11 | Discharge: 2015-06-11 | Disposition: A | Discharge status: Home / Self Care | Payer: BLUE CROSS/BLUE SHIELD | Attending: Emergency Medicine | Admitting: Emergency Medicine

## 2015-06-11 DIAGNOSIS — Z8639 Personal history of other endocrine, nutritional and metabolic disease: Secondary | ICD-10-CM | POA: Insufficient documentation

## 2015-06-11 DIAGNOSIS — Z720 Tobacco use: Secondary | ICD-10-CM | POA: Diagnosis not present

## 2015-06-11 DIAGNOSIS — Z8619 Personal history of other infectious and parasitic diseases: Secondary | ICD-10-CM | POA: Diagnosis not present

## 2015-06-11 DIAGNOSIS — Z79899 Other long term (current) drug therapy: Secondary | ICD-10-CM | POA: Insufficient documentation

## 2015-06-11 DIAGNOSIS — I10 Essential (primary) hypertension: Secondary | ICD-10-CM | POA: Diagnosis not present

## 2015-06-11 DIAGNOSIS — N8329 Other ovarian cysts: Secondary | ICD-10-CM | POA: Diagnosis not present

## 2015-06-11 DIAGNOSIS — R109 Unspecified abdominal pain: Secondary | ICD-10-CM

## 2015-06-11 DIAGNOSIS — Z862 Personal history of diseases of the blood and blood-forming organs and certain disorders involving the immune mechanism: Secondary | ICD-10-CM | POA: Insufficient documentation

## 2015-06-11 DIAGNOSIS — Z86018 Personal history of other benign neoplasm: Secondary | ICD-10-CM | POA: Diagnosis not present

## 2015-06-11 DIAGNOSIS — N83202 Unspecified ovarian cyst, left side: Secondary | ICD-10-CM

## 2015-06-11 LAB — COMPREHENSIVE METABOLIC PANEL
ALT: 14 U/L (ref 14–54)
AST: 21 U/L (ref 15–41)
Albumin: 3.8 g/dL (ref 3.5–5.0)
Alkaline Phosphatase: 78 U/L (ref 38–126)
Anion gap: 6 (ref 5–15)
BUN: 15 mg/dL (ref 6–20)
CHLORIDE: 105 mmol/L (ref 101–111)
CO2: 23 mmol/L (ref 22–32)
CREATININE: 1.14 mg/dL — AB (ref 0.44–1.00)
Calcium: 8.8 mg/dL — ABNORMAL LOW (ref 8.9–10.3)
GFR, EST NON AFRICAN AMERICAN: 55 mL/min — AB (ref >60)
Glucose, Bld: 133 mg/dL — ABNORMAL HIGH (ref 65–99)
POTASSIUM: 3.8 mmol/L (ref 3.5–5.1)
Sodium: 134 mmol/L — ABNORMAL LOW (ref 135–145)
Total Bilirubin: 0.7 mg/dL (ref 0.3–1.2)
Total Protein: 7.9 g/dL (ref 6.5–8.1)

## 2015-06-11 LAB — URINALYSIS, ROUTINE W REFLEX MICROSCOPIC
Bilirubin Urine: NEGATIVE
Glucose, UA: NEGATIVE mg/dL
KETONES UR: NEGATIVE mg/dL
LEUKOCYTES UA: NEGATIVE
NITRITE: NEGATIVE
PH: 6 (ref 5.0–8.0)
PROTEIN: NEGATIVE mg/dL
Specific Gravity, Urine: 1.009 (ref 1.005–1.030)
Urobilinogen, UA: 0.2 mg/dL (ref 0.0–1.0)

## 2015-06-11 LAB — URINE MICROSCOPIC-ADD ON

## 2015-06-11 LAB — CBC
HCT: 39.9 % (ref 36.0–46.0)
Hemoglobin: 12.6 g/dL (ref 12.0–15.0)
MCH: 27.2 pg (ref 26.0–34.0)
MCHC: 31.6 g/dL (ref 30.0–36.0)
MCV: 86 fL (ref 78.0–100.0)
PLATELETS: 325 10*3/uL (ref 150–400)
RBC: 4.64 MIL/uL (ref 3.87–5.11)
RDW: 14 % (ref 11.5–15.5)
WBC: 8.2 10*3/uL (ref 4.0–10.5)

## 2015-06-11 LAB — LIPASE, BLOOD: LIPASE: 43 U/L (ref 22–51)

## 2015-06-11 MED ORDER — IOHEXOL 300 MG/ML  SOLN
50.0000 mL | Freq: Once | INTRAMUSCULAR | Status: AC | PRN
Start: 2015-06-11 — End: 2015-06-11
  Administered 2015-06-11: 50 mL via ORAL

## 2015-06-11 MED ORDER — SODIUM CHLORIDE 0.9 % IV BOLUS (SEPSIS)
1000.0000 mL | Freq: Once | INTRAVENOUS | Status: AC
  Administered 2015-06-11: 1000 mL via INTRAVENOUS

## 2015-06-11 MED ORDER — IOHEXOL 300 MG/ML  SOLN
100.0000 mL | Freq: Once | INTRAMUSCULAR | Status: AC | PRN
  Administered 2015-06-11: 100 mL via INTRAVENOUS

## 2015-06-11 MED ORDER — HYDROCODONE-ACETAMINOPHEN 5-325 MG PO TABS: 1.0000 | ORAL_TABLET | Freq: Four times a day (QID) | ORAL | Status: DC | PRN

## 2015-06-11 MED ORDER — MORPHINE SULFATE (PF) 4 MG/ML IV SOLN
4.0000 mg | Freq: Once | INTRAVENOUS | Status: AC
  Administered 2015-06-11: 4 mg via INTRAVENOUS
  Filled 2015-06-11: qty 1

## 2015-06-11 MED ORDER — ONDANSETRON HCL 4 MG/2ML IJ SOLN
4.0000 mg | Freq: Once | INTRAMUSCULAR | Status: AC
Start: 2015-06-11 — End: 2015-06-11
  Administered 2015-06-11: 4 mg via INTRAVENOUS
  Filled 2015-06-11: qty 2

## 2015-06-11 NOTE — ED Notes (Signed)
Patient is a+o, verbally responsive. Respirations even and unlabored. ABCs intact.

## 2015-06-11 NOTE — ED Provider Notes (Signed)
CSN: [id number]     Arrival date & time 06/11/15  0623 History   First MD Initiated Contact with Patient 06/11/15 0725     Chief Complaint  Patient presents with  . Abdominal Pain     (Consider location/radiation/quality/duration/timing/severity/associated sxs/prior Treatment) Patient is a 49 y.o. female presenting with abdominal pain. The history is provided by the patient.  Abdominal Pain Associated symptoms: no chest pain, no chills, no constipation, no cough, no diarrhea, no dysuria, no fever, no hematuria, no shortness of breath, no sore throat, no vaginal bleeding, no vaginal discharge and no vomiting   Patient c/o left lower abdominal pain for the past couple days. Pain gradual onset, constant, moderate, non radiating. Denies abd distension. Prior abd surgery is remote hx hysterectomy. Had normal bm yesterday, no recent constipation or straining. No nvd. No dysuria or hematuria. No vaginal discharge or bleeding. Denies fever or chills. No hx same pain. No hx diverticula, no hx kidney stones. Denies injury or strain to area. No skin changes, swelling, or rash to area of pain.       Past Medical History  Diagnosis Date  . Hypertension   . Hyperlipidemia   . Scalp mass 02/25/2012  . Yeast infection   . Bacterial infection   . Chlamydia   . Anemia   . Migraine   . Fibroids    Past Surgical History  Procedure Laterality Date  . Cervix cerclage  1987  . Tubal ligation    . Wisdom tooth extraction    . Total abdominal hysterectomy     Family History  Problem Relation Age of Onset  . Heart disease Father   . Hypertension Father   . Stroke Father   . Hypertension Mother   . Hypertension Brother   . Asthma Sister   . Emphysema Sister   . Lung cancer Sister   . Sickle cell trait Daughter    Social History  Substance Use Topics  . Smoking status: Current Some Day Smoker    Types: Cigars  . Smokeless tobacco: Never Used  . Alcohol Use: No   OB History    Gravida  Para Term Preterm AB TAB SAB Ectopic Multiple Living   4 3   1  1   3      Review of Systems  Constitutional: Negative for fever and chills.  HENT: Negative for sore throat.   Eyes: Negative for redness.  Respiratory: Negative for cough and shortness of breath.   Cardiovascular: Negative for chest pain and leg swelling.  Gastrointestinal: Positive for abdominal pain. Negative for vomiting, diarrhea and constipation.  Endocrine: Negative for polyuria.  Genitourinary: Negative for dysuria, hematuria, flank pain, vaginal bleeding and vaginal discharge.  Musculoskeletal: Negative for back pain and neck pain.  Skin: Negative for rash.  Neurological: Negative for headaches.  Hematological: Does not bruise/bleed easily.  Psychiatric/Behavioral: Negative for confusion.      Allergies  Tylox  Home Medications   Prior to Admission medications   Medication Sig Start Date End Date Taking? Authorizing Provider  ibuprofen (ADVIL,MOTRIN) 800 MG tablet Take 1 tablet (800 mg total) by mouth 3 (three) times daily. 02/25/13   Teressa Lower, MD  metroNIDAZOLE (FLAGYL) 500 MG tablet Take 1 tablet (500 mg total) by mouth 2 (two) times daily. One po bid x 7 days 05/30/14   Mercedes Camprubi-Soms, PA-C   BP 173/88 mmHg  Pulse 73  Temp(Src) 98 F (36.7 C) (Oral)  Resp 18  SpO2 100% Physical Exam  Constitutional: She appears well-developed and well-nourished. No distress.  HENT:  Mouth/Throat: Oropharynx is clear and moist.  Eyes: Conjunctivae are normal. No scleral icterus.  Neck: Neck supple. No tracheal deviation present.  Cardiovascular: Normal rate, regular rhythm, normal heart sounds and intact distal pulses.   Pulmonary/Chest: Effort normal and breath sounds normal. No respiratory distress.  Abdominal: Soft. Normal appearance and bowel sounds are normal. She exhibits no distension and no mass. There is tenderness. There is no rebound and no guarding.  Left lower abdominal tenderness. No mass.  No incarc hernia felt.   Genitourinary:  No cva tenderness  Musculoskeletal: She exhibits no edema or tenderness.  Good rom left hip without pain. TLS spine non tender.   Neurological: She is alert.  Skin: Skin is warm and dry. No rash noted. She is not diaphoretic.  Psychiatric: She has a normal mood and affect.  Nursing note and vitals reviewed.   ED Course  Procedures (including critical care time) Labs Review  Results for orders placed or performed during the hospital encounter of 06/11/15  Lipase, blood  Result Value Ref Range   Lipase 43 22 - 51 U/L  Comprehensive metabolic panel  Result Value Ref Range   Sodium 134 (L) 135 - 145 mmol/L   Potassium 3.8 3.5 - 5.1 mmol/L   Chloride 105 101 - 111 mmol/L   CO2 23 22 - 32 mmol/L   Glucose, Bld 133 (H) 65 - 99 mg/dL   BUN 15 6 - 20 mg/dL   Creatinine, Ser 1.14 (H) 0.44 - 1.00 mg/dL   Calcium 8.8 (L) 8.9 - 10.3 mg/dL   Total Protein 7.9 6.5 - 8.1 g/dL   Albumin 3.8 3.5 - 5.0 g/dL   AST 21 15 - 41 U/L   ALT 14 14 - 54 U/L   Alkaline Phosphatase 78 38 - 126 U/L   Total Bilirubin 0.7 0.3 - 1.2 mg/dL   GFR calc non Af Amer 55 (L) >60 mL/min   GFR calc Af Amer >60 >60 mL/min   Anion gap 6 5 - 15  CBC  Result Value Ref Range   WBC 8.2 4.0 - 10.5 K/uL   RBC 4.64 3.87 - 5.11 MIL/uL   Hemoglobin 12.6 12.0 - 15.0 g/dL   HCT 39.9 36.0 - 46.0 %   MCV 86.0 78.0 - 100.0 fL   MCH 27.2 26.0 - 34.0 pg   MCHC 31.6 30.0 - 36.0 g/dL   RDW 14.0 11.5 - 15.5 %   Platelets 325 150 - 400 K/uL  Urinalysis, Routine w reflex microscopic (not at [facility])  Result Value Ref Range   Color, Urine YELLOW YELLOW   APPearance CLEAR CLEAR   Specific Gravity, Urine 1.009 1.005 - 1.030   pH 6.0 5.0 - 8.0   Glucose, UA NEGATIVE NEGATIVE mg/dL   Hgb urine dipstick TRACE (A) NEGATIVE   Bilirubin Urine NEGATIVE NEGATIVE   Ketones, ur NEGATIVE NEGATIVE mg/dL   Protein, ur NEGATIVE NEGATIVE mg/dL   Urobilinogen, UA 0.2 0.0 - 1.0 mg/dL   Nitrite NEGATIVE  NEGATIVE   Leukocytes, UA NEGATIVE NEGATIVE  Urine microscopic-add on  Result Value Ref Range   Squamous Epithelial / LPF RARE RARE   RBC / HPF 0-2 <3 RBC/hpf   Ct Abdomen Pelvis W Contrast  06/11/2015   CLINICAL DATA:  Acute left lower abdominal pain  EXAM: CT ABDOMEN AND PELVIS WITH CONTRAST  TECHNIQUE: Multidetector CT imaging of the abdomen and pelvis was performed using the standard  protocol following bolus administration of intravenous contrast.  CONTRAST:  40mL OMNIPAQUE IOHEXOL 300 MG/ML SOLN, 173mL OMNIPAQUE IOHEXOL 300 MG/ML SOLN  COMPARISON:  02/25/2013  FINDINGS: Lower chest: Clear lung bases. Normal heart size. No pericardial or pleural effusion.  Abdomen: Liver is image in the early portal phase therefore the hepatic veins are not well opacified. Portal vein is patent. No focal hepatic abnormality or biliary dilatation. Collapse gallbladder, biliary system, pancreas, spleen, adrenal glands, and kidneys are within normal limits for age and demonstrate no acute process. Duplicated left ureter noted on the delayed imaging.  No abdominal free fluid, fluid collection, hemorrhage, abscess, or adenopathy.  Negative for bowel obstruction, dilatation, ileus, or free air.  Intact aorta.  Negative for aneurysm.  Normal appendix demonstrated in the right lower quadrant.  Pelvis: Prior hysterectomy noted. Stable varicosities in the perirectal region along the internal iliac veins. No significant pelvic free fluid, fluid collection, hemorrhage, abscess, or adenopathy. No inguinal abnormality or hernia. Urinary bladder is underdistended. Scattered pelvic calcified venous phleboliths noted.  Small left ovarian hypodense cyst measures 26 x 23 mm, image 59.  no acute osseous finding. Facet arthropathy at L4-5 and L5-S1. No compression fracture.  IMPRESSION: No acute intra-abdominal or pelvic finding by CT.  Normal appendix  Prior hysterectomy.  2.6 cm small left ovarian cyst  Stable pelvic varicosities    Electronically Signed   By: Jerilynn Mages.  Shick M.D.   On: 06/11/2015 09:16      I have personally reviewed and evaluated these images and lab results as part of my medical decision-making.    MDM   Iv ns. Morphine iv. zofran iv. Labs.  Ct.  Reviewed nursing notes and prior charts for additional history.   Ct noted, small left ovarian cyst, otherwise no acute process noted.  Pt afeb. Pain controlled.   Pt currently appears stable for d/c.     Lajean Saver, MD 06/11/15 [phone]

## 2015-06-11 NOTE — ED Notes (Signed)
Pt gone to CT at 0850.

## 2015-06-11 NOTE — ED Notes (Signed)
Pt reported LLQ pain radiates to thigh. Pt reported urinary frequency/urgency, lower back pain, pressure with void. Abd soft/nondistended but tender to LLQ with movement. LBM yesterday.

## 2015-06-11 NOTE — ED Notes (Signed)
Pt from  Home c/o left lower abdominal pain that radiates to the left upper thigh.. She reports pain makes it difficult to walk. Denies dysuria but reports urinary frequency. She has taken motrin without relief.

## 2015-06-11 NOTE — Discharge Instructions (Signed)
It was our pleasure to provide your ER care today - we hope that you feel better.  Take motrin or aleve as need for pain. You may also take hydrocodone as need for pain. No driving when taking hydrocodone. Also, do not take tylenol or acetaminophen containing medication when taking hydrocodone.  Your ct scan was read as showing a 2.6 cm left ovarian cyst.  Follow up with your ob/gyn doctor in the coming week if symptoms fail to improve/resolve.  Your blood pressure is high today - follow up with primary care doctor in the next couple weeks.   Return to ER if worse, new symptoms, fevers, worsening or intractable pain, other concern.  You were given pain medication in the ER - no driving for the next 4 hours.       Abdominal Pain Many things can cause abdominal pain. Usually, abdominal pain is not caused by a disease and will improve without treatment. It can often be observed and treated at home. Your health care provider will do a physical exam and possibly order blood tests and X-rays to help determine the seriousness of your pain. However, in many cases, more time must pass before a clear cause of the pain can be found. Before that point, your health care provider may not know if you need more testing or further treatment. HOME CARE INSTRUCTIONS  Monitor your abdominal pain for any changes. The following actions may help to alleviate any discomfort you are experiencing:  Only take over-the-counter or prescription medicines as directed by your health care provider.  Do not take laxatives unless directed to do so by your health care provider.  Try a clear liquid diet (broth, tea, or water) as directed by your health care provider. Slowly move to a bland diet as tolerated. SEEK MEDICAL CARE IF:  You have unexplained abdominal pain.  You have abdominal pain associated with nausea or diarrhea.  You have pain when you urinate or have a bowel movement.  You experience abdominal pain  that wakes you in the night.  You have abdominal pain that is worsened or improved by eating food.  You have abdominal pain that is worsened with eating fatty foods.  You have a fever. SEEK IMMEDIATE MEDICAL CARE IF:   Your pain does not go away within 2 hours.  You keep throwing up (vomiting).  Your pain is felt only in portions of the abdomen, such as the right side or the left lower portion of the abdomen.  You pass bloody or black tarry stools. MAKE SURE YOU:  Understand these instructions.   Will watch your condition.   Will get help right away if you are not doing well or get worse.  Document Released: 07/11/2005 Document Revised: 10/06/2013 Document Reviewed: 06/10/2013 [facility] Patient Information 2015 [place], [place]. This information is not intended to replace advice given to you by your health care provider. Make sure you discuss any questions you have with your health care provider.    Ovarian Cyst An ovarian cyst is a fluid-filled sac that forms on an ovary. The ovaries are small organs that produce eggs in women. Various types of cysts can form on the ovaries. Most are not cancerous. Many do not cause problems, and they often go away on their own. Some may cause symptoms and require treatment. Common types of ovarian cysts include:  Functional cysts--These cysts may occur every month during the menstrual cycle. This is normal. The cysts usually go away with the next menstrual  cycle if the woman does not get pregnant. Usually, there are no symptoms with a functional cyst.  Endometrioma cysts--These cysts form from the tissue that lines the uterus. They are also called "chocolate cysts" because they become filled with blood that turns brown. This type of cyst can cause pain in the lower abdomen during intercourse and with your menstrual period.  Cystadenoma cysts--This type develops from the cells on the outside of the ovary. These cysts can get very big and cause  lower abdomen pain and pain with intercourse. This type of cyst can twist on itself, cut off its blood supply, and cause severe pain. It can also easily rupture and cause a lot of pain.  Dermoid cysts--This type of cyst is sometimes found in both ovaries. These cysts may contain different kinds of body tissue, such as skin, teeth, hair, or cartilage. They usually do not cause symptoms unless they get very big.  Theca lutein cysts--These cysts occur when too much of a certain hormone (human chorionic gonadotropin) is produced and overstimulates the ovaries to produce an egg. This is most common after procedures used to assist with the conception of a baby (in vitro fertilization). CAUSES   Fertility drugs can cause a condition in which multiple large cysts are formed on the ovaries. This is called ovarian hyperstimulation syndrome.  A condition called polycystic ovary syndrome can cause hormonal imbalances that can lead to nonfunctional ovarian cysts. SIGNS AND SYMPTOMS  Many ovarian cysts do not cause symptoms. If symptoms are present, they may include:  Pelvic pain or pressure.  Pain in the lower abdomen.  Pain during sexual intercourse.  Increasing girth (swelling) of the abdomen.  Abnormal menstrual periods.  Increasing pain with menstrual periods.  Stopping having menstrual periods without being pregnant. DIAGNOSIS  These cysts are commonly found during a routine or annual pelvic exam. Tests may be ordered to find out more about the cyst. These tests may include:  Ultrasound.  X-ray of the pelvis.  CT scan.  MRI.  Blood tests. TREATMENT  Many ovarian cysts go away on their own without treatment. Your health care provider may want to check your cyst regularly for 2-3 months to see if it changes. For women in menopause, it is particularly important to monitor a cyst closely because of the higher rate of ovarian cancer in menopausal women. When treatment is needed, it may  include any of the following:  A procedure to drain the cyst (aspiration). This may be done using a long needle and ultrasound. It can also be done through a laparoscopic procedure. This involves using a thin, lighted tube with a tiny camera on the end (laparoscope) inserted through a small incision.  Surgery to remove the whole cyst. This may be done using laparoscopic surgery or an open surgery involving a larger incision in the lower abdomen.  Hormone treatment or birth control pills. These methods are sometimes used to help dissolve a cyst. HOME CARE INSTRUCTIONS   Only take over-the-counter or prescription medicines as directed by your health care provider.  Follow up with your health care provider as directed.  Get regular pelvic exams and Pap tests. SEEK MEDICAL CARE IF:   Your periods are late, irregular, or painful, or they stop.  Your pelvic pain or abdominal pain does not go away.  Your abdomen becomes larger or swollen.  You have pressure on your bladder or trouble emptying your bladder completely.  You have pain during sexual intercourse.  You have feelings  of fullness, pressure, or discomfort in your stomach.  You lose weight for no apparent reason.  You feel generally ill.  You become constipated.  You lose your appetite.  You develop acne.  You have an increase in body and facial hair.  You are gaining weight, without changing your exercise and eating habits.  You think you are pregnant. SEEK IMMEDIATE MEDICAL CARE IF:   You have increasing abdominal pain.  You feel sick to your stomach (nauseous), and you throw up (vomit).  You develop a fever that comes on suddenly.  You have abdominal pain during a bowel movement.  Your menstrual periods become heavier than usual. MAKE SURE YOU:  Understand these instructions.  Will watch your condition.  Will get help right away if you are not doing well or get worse. Document Released: 10/01/2005  Document Revised: 10/06/2013 Document Reviewed: 06/08/2013 [facility] Patient Information 2015 [place], [place]. This information is not intended to replace advice given to you by your health care provider. Make sure you discuss any questions you have with your health care provider.

## 2015-06-25 ENCOUNTER — Emergency Department (HOSPITAL_COMMUNITY)
Admission: EM | Admit: 2015-06-25 | Discharge: 2015-06-25 | Discharge status: Absent without leave | Payer: Managed Care, Other (non HMO) | Source: Home / Self Care

## 2015-06-25 ENCOUNTER — Emergency Department (HOSPITAL_COMMUNITY): Payer: BLUE CROSS/BLUE SHIELD

## 2015-06-25 ENCOUNTER — Encounter (HOSPITAL_COMMUNITY): Payer: Self-pay | Admitting: Emergency Medicine

## 2015-06-25 ENCOUNTER — Emergency Department (HOSPITAL_COMMUNITY)
Admission: EM | Admit: 2015-06-25 | Discharge: 2015-06-26 | Disposition: A | Discharge status: Home / Self Care | Payer: BLUE CROSS/BLUE SHIELD | Attending: Emergency Medicine | Admitting: Emergency Medicine

## 2015-06-25 DIAGNOSIS — R079 Chest pain, unspecified: Secondary | ICD-10-CM | POA: Insufficient documentation

## 2015-06-25 DIAGNOSIS — Z86018 Personal history of other benign neoplasm: Secondary | ICD-10-CM | POA: Diagnosis not present

## 2015-06-25 DIAGNOSIS — R519 Headache, unspecified: Secondary | ICD-10-CM

## 2015-06-25 DIAGNOSIS — Z720 Tobacco use: Secondary | ICD-10-CM | POA: Insufficient documentation

## 2015-06-25 DIAGNOSIS — R112 Nausea with vomiting, unspecified: Secondary | ICD-10-CM | POA: Insufficient documentation

## 2015-06-25 DIAGNOSIS — R197 Diarrhea, unspecified: Secondary | ICD-10-CM | POA: Diagnosis not present

## 2015-06-25 DIAGNOSIS — Z8639 Personal history of other endocrine, nutritional and metabolic disease: Secondary | ICD-10-CM | POA: Insufficient documentation

## 2015-06-25 DIAGNOSIS — Z862 Personal history of diseases of the blood and blood-forming organs and certain disorders involving the immune mechanism: Secondary | ICD-10-CM | POA: Diagnosis not present

## 2015-06-25 DIAGNOSIS — I10 Essential (primary) hypertension: Secondary | ICD-10-CM | POA: Diagnosis not present

## 2015-06-25 DIAGNOSIS — R51 Headache: Secondary | ICD-10-CM | POA: Diagnosis not present

## 2015-06-25 DIAGNOSIS — Z8619 Personal history of other infectious and parasitic diseases: Secondary | ICD-10-CM | POA: Diagnosis not present

## 2015-06-25 DIAGNOSIS — R509 Fever, unspecified: Secondary | ICD-10-CM | POA: Diagnosis present

## 2015-06-25 LAB — BASIC METABOLIC PANEL
Anion gap: 9 (ref 5–15)
BUN: 10 mg/dL (ref 6–20)
CHLORIDE: 104 mmol/L (ref 101–111)
CO2: 23 mmol/L (ref 22–32)
CREATININE: 1.02 mg/dL — AB (ref 0.44–1.00)
Calcium: 9.2 mg/dL (ref 8.9–10.3)
Glucose, Bld: 111 mg/dL — ABNORMAL HIGH (ref 65–99)
Potassium: 3.6 mmol/L (ref 3.5–5.1)
SODIUM: 136 mmol/L (ref 135–145)

## 2015-06-25 LAB — CBC
HCT: 41.4 % (ref 36.0–46.0)
Hemoglobin: 13.6 g/dL (ref 12.0–15.0)
MCH: 27.9 pg (ref 26.0–34.0)
MCHC: 32.9 g/dL (ref 30.0–36.0)
MCV: 85 fL (ref 78.0–100.0)
PLATELETS: 344 10*3/uL (ref 150–400)
RBC: 4.87 MIL/uL (ref 3.87–5.11)
RDW: 14 % (ref 11.5–15.5)
WBC: 7.9 10*3/uL (ref 4.0–10.5)

## 2015-06-25 LAB — I-STAT TROPONIN, ED: TROPONIN I, POC: 0 ng/mL (ref 0.00–0.08)

## 2015-06-25 MED ORDER — DIPHENHYDRAMINE HCL 50 MG/ML IJ SOLN
25.0000 mg | Freq: Once | INTRAMUSCULAR | Status: AC
  Administered 2015-06-26: 25 mg via INTRAVENOUS
  Filled 2015-06-25: qty 1

## 2015-06-25 MED ORDER — LOPERAMIDE HCL 2 MG PO CAPS
4.0000 mg | ORAL_CAPSULE | Freq: Once | ORAL | Status: AC
  Administered 2015-06-26: 4 mg via ORAL
  Filled 2015-06-25: qty 2

## 2015-06-25 MED ORDER — SODIUM CHLORIDE 0.9 % IV SOLN
1000.0000 mL | INTRAVENOUS | Status: DC
  Administered 2015-06-26: 1000 mL via INTRAVENOUS

## 2015-06-25 MED ORDER — METOCLOPRAMIDE HCL 5 MG/ML IJ SOLN
10.0000 mg | Freq: Once | INTRAMUSCULAR | Status: AC
  Administered 2015-06-26: 10 mg via INTRAVENOUS
  Filled 2015-06-25: qty 2

## 2015-06-25 MED ORDER — SODIUM CHLORIDE 0.9 % IV SOLN
1000.0000 mL | Freq: Once | INTRAVENOUS | Status: AC
  Administered 2015-06-26: 1000 mL via INTRAVENOUS

## 2015-06-25 NOTE — ED Notes (Signed)
Pt from home c/o central chest "contraction", fever, and weakness since yesterday. She reports fever as high a 101. Pt afebrile in triage, alert and oriented. Pt reports "20" episodes of diarrhea that is "all water".

## 2015-06-25 NOTE — ED Provider Notes (Signed)
CSN: [id number]     Arrival date & time 06/25/15  1824 History  This chart was scribed for Diana Fuel, MD by Julien Nordmann, ED Scribe. This patient was seen in room WA20/WA20 and the patient's care was started at 11:07 PM.     Chief Complaint  Patient presents with  . Fever  . Weakness  . Chest Pain      The history is provided by the patient. No language interpreter was used.   HPI Comments: [name] is a 49 y.o. female with a hx of HTN and hyperlipidemia who presents to the Emergency Department complaining of constant, gradual worsening generalized weakness onset yesterday. She notes coming home from work yesterday and feeling as if her symptoms came upon her all of a sudden. Pt notes she had associated weakness, vomiting, diarrhea, headache, fever, chills, body aches and chest pain. Pt notes her chest feels as if it is pounding and "contracting". She rates her current chest pain an 8/10. She denies being around sick patients.  Past Medical History  Diagnosis Date  . Hypertension   . Hyperlipidemia   . Scalp mass 02/25/2012  . Yeast infection   . Bacterial infection   . Chlamydia   . Anemia   . Migraine   . Fibroids    Past Surgical History  Procedure Laterality Date  . Cervix cerclage  1987  . Tubal ligation    . Wisdom tooth extraction    . Total abdominal hysterectomy     Family History  Problem Relation Age of Onset  . Heart disease Father   . Hypertension Father   . Stroke Father   . Hypertension Mother   . Hypertension Brother   . Asthma Sister   . Emphysema Sister   . Lung cancer Sister   . Sickle cell trait Daughter    Social History  Substance Use Topics  . Smoking status: Current Some Day Smoker    Types: Cigars  . Smokeless tobacco: Never Used  . Alcohol Use: No   OB History    Gravida Para Term Preterm AB TAB SAB Ectopic Multiple Living   4 3   1  1   3      Review of Systems  Constitutional: Positive for fever and chills.   Cardiovascular: Positive for chest pain.  Gastrointestinal: Positive for vomiting and diarrhea.  Neurological: Positive for headaches.  All other systems reviewed and are negative.     Allergies  Tylox  Home Medications   Prior to Admission medications   Medication Sig Start Date End Date Taking? Authorizing Provider  HYDROcodone-acetaminophen (NORCO/VICODIN) 5-325 MG per tablet Take 1-2 tablets by mouth every 6 (six) hours as needed for moderate pain. 06/11/15  Yes Lajean Saver, MD  metroNIDAZOLE (FLAGYL) 500 MG tablet Take 1 tablet (500 mg total) by mouth 2 (two) times daily. One po bid x 7 days Patient not taking: Reported on 06/11/2015 05/30/14   Mercedes Camprubi-Soms, PA-C   Triage vitals: BP 168/94 mmHg  Pulse 80  Temp(Src) 100 F (37.8 C) (Oral)  Resp 16  SpO2 93% Physical Exam  Constitutional: She is oriented to person, place, and time. She appears well-developed and well-nourished. No distress.  HENT:  Head: Normocephalic and atraumatic.  Eyes: EOM are normal.  Neck: Normal range of motion.  Cardiovascular: Normal rate, regular rhythm and normal heart sounds.   Pulmonary/Chest: Effort normal and breath sounds normal.  Abdominal: Soft. She exhibits no distension. There is no tenderness.  Decreased bowels sounds  Musculoskeletal: Normal range of motion.  Neurological: She is alert and oriented to person, place, and time.  Skin: Skin is warm and dry.  Psychiatric: She has a normal mood and affect. Judgment normal.  Nursing note and vitals reviewed.   ED Course  Procedures  DIAGNOSTIC STUDIES: Oxygen Saturation is 93% on RA, low by my interpretation.  COORDINATION OF CARE:  11:13 PM Discussed treatment plan which includes IV fluids, Reglan, Benadryl, and Imodium with pt at bedside and pt agreed to plan.  Labs Review Labs Reviewed  BASIC METABOLIC PANEL - Abnormal; Notable for the following:    Glucose, Bld 111 (*)    Creatinine, Ser 1.02 (*)    All other  components within normal limits  CBC  I-STAT TROPOININ, ED    Imaging Review Dg Chest 2 View  06/25/2015   CLINICAL DATA:  Fever, chest pain.  EXAM: CHEST  2 VIEW  COMPARISON:  August 03, 2004.  FINDINGS: The heart size and mediastinal contours are within normal limits. Both lungs are clear. No pneumothorax or pleural effusion is noted. The visualized skeletal structures are unremarkable.  IMPRESSION: No active cardiopulmonary disease.   Electronically Signed   By: Marijo Conception, M.D.   On: 06/25/2015 20:16   I have personally reviewed and evaluated these images and lab results as part of my medical decision-making.   EKG Interpretation   Date/Time:  Saturday June 25 2015 19:17:10 EDT Ventricular Rate:  75 PR Interval:  162 QRS Duration: 72 QT Interval:  371 QTC Calculation: 414 R Axis:   80 Text Interpretation:  Normal sinus rhythm Non-specific ST-t changes  Inferior leads No old tracing to compare Confirmed by RAY MD, Andee Poles  [phone]) on 06/25/2015 7:21:10 PM      MDM   Final diagnoses:  Nausea vomiting and diarrhea  Headache, unspecified headache type    Nausea, vomiting, diarrhea consistent with viral gastroenteritis. Headache of uncertain cause, but probably part of viral syndrome. Old records are reviewed and she had abdominal pain at that time and CT showed an ovarian cyst. She will need to follow-up with her gynecologist regarding the cyst. Blood pressure is noted to be elevated and she is advised to monitor her blood pressure at home. If blood pressures consistently elevated, she will may need to be on medication to control it.  I personally performed the services described in this documentation, which was scribed in my presence. The recorded information has been reviewed and is accurate.     Diana Fuel, MD 81/82/99 [id number]

## 2015-06-26 MED ORDER — METOCLOPRAMIDE HCL 10 MG PO TABS: 10.0000 mg | ORAL_TABLET | Freq: Four times a day (QID) | ORAL | Status: DC | PRN

## 2015-06-26 NOTE — Discharge Instructions (Signed)
Please monitor your blood pressure at home and keep a record of it. If your blood pressure is consistently elevated (higher than 140/90), you may need to be on medication to control it.  Take loperamide (Imodium AD) as needed for diarrhea.  Nausea and Vomiting Nausea is a sick feeling that often comes before throwing up (vomiting). Vomiting is a reflex where stomach contents come out of your mouth. Vomiting can cause severe loss of body fluids (dehydration). Children and elderly adults can become dehydrated quickly, especially if they also have diarrhea. Nausea and vomiting are symptoms of a condition or disease. It is important to find the cause of your symptoms. CAUSES   Direct irritation of the stomach lining. This irritation can result from increased acid production (gastroesophageal reflux disease), infection, food poisoning, taking certain medicines (such as nonsteroidal anti-inflammatory drugs), alcohol use, or tobacco use.  Signals from the brain.These signals could be caused by a headache, heat exposure, an inner ear disturbance, increased pressure in the brain from injury, infection, a tumor, or a concussion, pain, emotional stimulus, or metabolic problems.  An obstruction in the gastrointestinal tract (bowel obstruction).  Illnesses such as diabetes, hepatitis, gallbladder problems, appendicitis, kidney problems, cancer, sepsis, atypical symptoms of a heart attack, or eating disorders.  Medical treatments such as chemotherapy and radiation.  Receiving medicine that makes you sleep (general anesthetic) during surgery. DIAGNOSIS Your caregiver may ask for tests to be done if the problems do not improve after a few days. Tests may also be done if symptoms are severe or if the reason for the nausea and vomiting is not clear. Tests may include:  Urine tests.  Blood tests.  Stool tests.  Cultures (to look for evidence of infection).  X-rays or other imaging studies. Test results  can help your caregiver make decisions about treatment or the need for additional tests. TREATMENT You need to stay well hydrated. Drink frequently but in small amounts.You may wish to drink water, sports drinks, clear broth, or eat frozen ice pops or gelatin dessert to help stay hydrated.When you eat, eating slowly may help prevent nausea.There are also some antinausea medicines that may help prevent nausea. HOME CARE INSTRUCTIONS   Take all medicine as directed by your caregiver.  If you do not have an appetite, do not force yourself to eat. However, you must continue to drink fluids.  If you have an appetite, eat a normal diet unless your caregiver tells you differently.  Eat a variety of complex carbohydrates (rice, wheat, potatoes, bread), lean meats, yogurt, fruits, and vegetables.  Avoid high-fat foods because they are more difficult to digest.  Drink enough water and fluids to keep your urine clear or pale yellow.  If you are dehydrated, ask your caregiver for specific rehydration instructions. Signs of dehydration may include:  Severe thirst.  Dry lips and mouth.  Dizziness.  Dark urine.  Decreasing urine frequency and amount.  Confusion.  Rapid breathing or pulse. SEEK IMMEDIATE MEDICAL CARE IF:   You have blood or brown flecks (like coffee grounds) in your vomit.  You have black or bloody stools.  You have a severe headache or stiff neck.  You are confused.  You have severe abdominal pain.  You have chest pain or trouble breathing.  You do not urinate at least once every 8 hours.  You develop cold or clammy skin.  You continue to vomit for longer than 24 to 48 hours.  You have a fever. MAKE SURE YOU:  Understand these instructions.  Will watch your condition.  Will get help right away if you are not doing well or get worse. Document Released: 10/01/2005 Document Revised: 12/24/2011 Document Reviewed: 02/28/2011 [facility] Patient Information  2015 [place], [place]. This information is not intended to replace advice given to you by your health care provider. Make sure you discuss any questions you have with your health care provider.  General Headache Without Cause A headache is pain or discomfort felt around the head or neck area. The specific cause of a headache may not be found. There are many causes and types of headaches. A few common ones are:  Tension headaches.  Migraine headaches.  Cluster headaches.  Chronic daily headaches. HOME CARE INSTRUCTIONS   Keep all follow-up appointments with your caregiver or any specialist referral.  Only take over-the-counter or prescription medicines for pain or discomfort as directed by your caregiver.  Lie down in a dark, quiet room when you have a headache.  Keep a headache journal to find out what may trigger your migraine headaches. For example, write down:  What you eat and drink.  How much sleep you get.  Any change to your diet or medicines.  Try massage or other relaxation techniques.  Put ice packs or heat on the head and neck. Use these 3 to 4 times per day for 15 to 20 minutes each time, or as needed.  Limit stress.  Sit up straight, and do not tense your muscles.  Quit smoking if you smoke.  Limit alcohol use.  Decrease the amount of caffeine you drink, or stop drinking caffeine.  Eat and sleep on a regular schedule.  Get 7 to 9 hours of sleep, or as recommended by your caregiver.  Keep lights dim if bright lights bother you and make your headaches worse. SEEK MEDICAL CARE IF:   You have problems with the medicines you were prescribed.  Your medicines are not working.  You have a change from the usual headache.  You have nausea or vomiting. SEEK IMMEDIATE MEDICAL CARE IF:   Your headache becomes severe.  You have a fever.  You have a stiff neck.  You have loss of vision.  You have muscular weakness or loss of muscle control.  You start  losing your balance or have trouble walking.  You feel faint or pass out.  You have severe symptoms that are different from your first symptoms. MAKE SURE YOU:   Understand these instructions.  Will watch your condition.  Will get help right away if you are not doing well or get worse. Document Released: 10/01/2005 Document Revised: 12/24/2011 Document Reviewed: 10/17/2011 [facility] Patient Information 2015 [place], [place]. This information is not intended to replace advice given to you by your health care provider. Make sure you discuss any questions you have with your health care provider.

## 2015-07-13 ENCOUNTER — Ambulatory Visit (INDEPENDENT_AMBULATORY_CARE_PROVIDER_SITE_OTHER): Payer: BLUE CROSS/BLUE SHIELD | Admitting: Family

## 2015-07-13 ENCOUNTER — Encounter: Payer: Self-pay | Admitting: Family

## 2015-07-13 ENCOUNTER — Other Ambulatory Visit (INDEPENDENT_AMBULATORY_CARE_PROVIDER_SITE_OTHER): Payer: BLUE CROSS/BLUE SHIELD

## 2015-07-13 ENCOUNTER — Telehealth: Payer: Self-pay | Admitting: Family

## 2015-07-13 VITALS — BP 160/110 | HR 74 | Temp 98.4°F | Resp 18 | Ht 62.0 in | Wt 164.0 lb

## 2015-07-13 DIAGNOSIS — I10 Essential (primary) hypertension: Secondary | ICD-10-CM | POA: Diagnosis not present

## 2015-07-13 DIAGNOSIS — N83209 Unspecified ovarian cyst, unspecified side: Secondary | ICD-10-CM | POA: Insufficient documentation

## 2015-07-13 DIAGNOSIS — Z23 Encounter for immunization: Secondary | ICD-10-CM | POA: Diagnosis not present

## 2015-07-13 DIAGNOSIS — R350 Frequency of micturition: Secondary | ICD-10-CM

## 2015-07-13 DIAGNOSIS — N8320 Unspecified ovarian cysts: Secondary | ICD-10-CM | POA: Diagnosis not present

## 2015-07-13 DIAGNOSIS — N83202 Unspecified ovarian cyst, left side: Secondary | ICD-10-CM

## 2015-07-13 DIAGNOSIS — Z1231 Encounter for screening mammogram for malignant neoplasm of breast: Secondary | ICD-10-CM

## 2015-07-13 LAB — BASIC METABOLIC PANEL
BUN: 11 mg/dL (ref 6–23)
CALCIUM: 9.1 mg/dL (ref 8.4–10.5)
CO2: 29 mEq/L (ref 19–32)
CREATININE: 0.84 mg/dL (ref 0.40–1.20)
Chloride: 105 mEq/L (ref 96–112)
GFR: 91.95 mL/min (ref >60.00)
Glucose, Bld: 92 mg/dL (ref 70–99)
Potassium: 3.8 mEq/L (ref 3.5–5.1)
SODIUM: 138 meq/L (ref 135–145)

## 2015-07-13 LAB — HEMOGLOBIN A1C: HEMOGLOBIN A1C: 5.9 % (ref 4.6–6.5)

## 2015-07-13 MED ORDER — LISINOPRIL-HYDROCHLOROTHIAZIDE 20-25 MG PO TABS: 1.0000 | ORAL_TABLET | Freq: Every day | ORAL | Status: DC

## 2015-07-13 NOTE — Assessment & Plan Note (Addendum)
Hypertension uncontrolled and not currently maintained on medication. Discussed importance of decreasing blood pressure to reduce the risk of end organ damage in the future. Start lisinopril-hydrochlorothiazide. Continue to monitor blood pressure at home. Dash eating plan provided. Follow-up in one month for blood pressure check and basic metabolic panel.

## 2015-07-13 NOTE — Progress Notes (Signed)
Subjective:    Patient ID: [name], female    DOB: 1966/03/15, 49 y.o.   MRN: [id number]  Chief Complaint  Patient presents with  . Establish Care    wants to talk about BP, had a pain in her abdomen and went to the ER they told her she had an ovarian cyst and that it would go away but she still feels discomfort, and wants to get checked for diabetes    HPI:  [name] is a 49 y.o. female who  has a past medical history of Hypertension; Hyperlipidemia; Scalp mass (02/25/2012); Yeast infection; Bacterial infection; Chlamydia; Migraine; Fibroids; and Anemia. and presents today for an an office visit to establish care.   1.) Hypertension - Previously diagnosed with hypertension. Initially on blood pressure medication which she stopped as her blood pressure normalized. Not currently taking any medications. Recently noted that her blood pressure has been slowly increasing over time. Denies any symptoms of end organ damage.    BP Readings from Last 3 Encounters:  07/13/15 160/110  06/26/15 160/90  06/11/15 183/113    2.) Concern for diabetes - Does experess some occasional numbness and tingling. Has a significant family history for diabetes. Does express some faitigue, frequent urination and hunger.  Lab Results  Component Value Date   HGBA1C 5.9 07/13/2015      3.) Ovarian cyst - Recently seen in the ED for lower abdominal pain and was informed that she had an ovarian cyst which was confirmed on CT scan. She was instructed to monitor and that it would most likely go away on its own. Continues to experience the associated symptom of pain in her lower left quadrant described as sharp and achy with a severity of 7/10. She was given pain medication in the ED.    Allergies  Allergen Reactions  . Tylox [Oxycodone-Acetaminophen] Itching     Outpatient Prescriptions Prior to Visit  Medication Sig Dispense Refill  . HYDROcodone-acetaminophen (NORCO/VICODIN) 5-325 MG per  tablet Take 1-2 tablets by mouth every 6 (six) hours as needed for moderate pain. 10 tablet 0  . metoCLOPramide (REGLAN) 10 MG tablet Take 1 tablet (10 mg total) by mouth every 6 (six) hours as needed for nausea. 30 tablet 0   No facility-administered medications prior to visit.     Past Medical History  Diagnosis Date  . Hypertension   . Hyperlipidemia   . Scalp mass 02/25/2012  . Yeast infection   . Bacterial infection   . Chlamydia   . Migraine   . Fibroids   . Anemia     iron deficiency     Past Surgical History  Procedure Laterality Date  . Cervix cerclage  1987  . Tubal ligation    . Wisdom tooth extraction    . Total abdominal hysterectomy       Family History  Problem Relation Age of Onset  . Heart disease Father   . Hypertension Father   . Stroke Father   . Hypertension Mother   . Multiple sclerosis Mother   . Hypertension Brother   . Asthma Sister   . Emphysema Sister   . Lung cancer Sister   . Sickle cell trait Daughter   . Cancer Maternal Grandmother   . Diabetes Maternal Grandfather   . Healthy Paternal Grandmother      Social History   Social History  . Marital Status: Legally Separated    Spouse Name: N/A  . Number of Children: 3  .  Years of Education: 14   Occupational History  . Coordinator    Social History Main Topics  . Smoking status: Current Some Day Smoker    Types: Cigars, Cigarettes  . Smokeless tobacco: Never Used  . Alcohol Use: 0.6 oz/week    1 Cans of beer per week  . Drug Use: No  . Sexual Activity: Yes    Birth Control/ Protection: Surgical, Condom   Other Topics Concern  . Not on file   Social History Narrative   Fun: Family time, movies and relaxing, outdoors   Feels safe at home and denies abuse     Review of Systems  Eyes:       Negative for changes in vision.   Respiratory: Negative for chest tightness and shortness of breath.   Cardiovascular: Negative for chest pain, palpitations and leg swelling.    Neurological: Positive for numbness and headaches.      Objective:    BP 160/110 mmHg  Pulse 74  Temp(Src) 98.4 F (36.9 C) (Oral)  Resp 18  Ht 5\' 2"  (1.575 m)  Wt 164 lb (74.39 kg)  BMI 29.99 kg/m2  SpO2 99% Nursing note and vital signs reviewed.  Physical Exam  Constitutional: She is oriented to person, place, and time. She appears well-developed and well-nourished. No distress.  Cardiovascular: Normal rate, regular rhythm, normal heart sounds and intact distal pulses.   Pulmonary/Chest: Effort normal and breath sounds normal.  Abdominal: Soft. Normal appearance and bowel sounds are normal. She exhibits no mass. There is no hepatosplenomegaly. There is tenderness in the left lower quadrant. There is no rebound.  Neurological: She is alert and oriented to person, place, and time.  Skin: Skin is warm and dry.  Psychiatric: She has a normal mood and affect. Her behavior is normal. Judgment and thought content normal.       Assessment & Plan:   Problem List Items Addressed This Visit      Cardiovascular and Mediastinum   Essential hypertension - Primary    Hypertension uncontrolled and not currently maintained on medication. Discussed importance of decreasing blood pressure to reduce the risk of end organ damage in the future. Start lisinopril-hydrochlorothiazide. Continue to monitor blood pressure at home. Dash eating plan provided. Follow-up in one month for blood pressure check and basic metabolic panel.      Relevant Medications   lisinopril-hydrochlorothiazide (PRINZIDE,ZESTORETIC) 20-25 MG tablet   Other Relevant Orders   Basic Metabolic Panel (BMET) (Completed)     Genitourinary   Ovarian cyst    Ovarian cyst confirmed with CT scan or recent emergency room visit. Continues to experience lower abdominal pain which is consistent with the CT findings. Recommend follow-up with gynecology for further management of the ovarian cyst. Continue over-the-counter medication as  needed for symptom relief and supportive care.        Other   Urinary frequency    Family history of diabetes and noted increased frequency of urination and hunger lately. Obtain A1c to rule out diabetes. Discussed importance of adequate nutrition with nutrient dense foods and avoiding processed/sugary foods, increasing physical activity to most days of the week. Follow-up pending A1c results.      Relevant Orders   Hemoglobin A1c (Completed)    Other Visit Diagnoses    Encounter for screening mammogram for breast cancer        Relevant Orders    MM DIGITAL SCREENING BILATERAL    Need for TD vaccine  Relevant Orders    Td vaccine greater than or equal to 7yo preservative free IM (Completed)

## 2015-07-13 NOTE — Progress Notes (Signed)
Pre visit review using our clinic review tool, if applicable. No additional management support is needed unless otherwise documented below in the visit note. 

## 2015-07-13 NOTE — Telephone Encounter (Signed)
Please inform patient that her blood work shows that her electrolytes and kidney function are both normal. Her A1c of 5.9 places her in the pre-diabetes category. While no medication is needed at this time, fine tuning and adjusting nutrition to emphasize fruits, vegetables and legumes while decreasing saturated fats and processed/sugary foods will help to reduce this and prevent the development of diabetes. Also increasing physical activity will help with the goal of 30 minutes most days of the week.

## 2015-07-13 NOTE — Patient Instructions (Signed)
Thank you for choosing [facility].  Summary/Instructions:  Your prescription(s) have been submitted to your pharmacy or been printed and provided for you. Please take as directed and contact our office if you believe you are having problem(s) with the medication(s) or have any questions.  If your symptoms worsen or fail to improve, please contact our office for further instruction, or in case of emergency go directly to the emergency room at the closest medical facility.   DASH Eating Plan DASH stands for "Dietary Approaches to Stop Hypertension." The DASH eating plan is a healthy eating plan that has been shown to reduce high blood pressure (hypertension). Additional health benefits may include reducing the risk of type 2 diabetes mellitus, heart disease, and stroke. The DASH eating plan may also help with weight loss. WHAT DO I NEED TO KNOW ABOUT THE DASH EATING PLAN? For the DASH eating plan, you will follow these general guidelines:  Choose foods with a percent daily value for sodium of less than 5% (as listed on the food label).  Use salt-free seasonings or herbs instead of table salt or sea salt.  Check with your health care provider or pharmacist before using salt substitutes.  Eat lower-sodium products, often labeled as "lower sodium" or "no salt added."  Eat fresh foods.  Eat more vegetables, fruits, and low-fat dairy products.  Choose whole grains. Look for the word "whole" as the first word in the ingredient list.  Choose fish and skinless chicken or [place] more often than red meat. Limit fish, poultry, and meat to 6 oz (170 g) each day.  Limit sweets, desserts, sugars, and sugary drinks.  Choose heart-healthy fats.  Limit cheese to 1 oz (28 g) per day.  Eat more home-cooked food and less restaurant, buffet, and fast food.  Limit fried foods.  Cook foods using methods other than frying.  Limit canned vegetables. If you do use them, rinse them well to  decrease the sodium.  When eating at a restaurant, ask that your food be prepared with less salt, or no salt if possible. WHAT FOODS CAN I EAT? Seek help from a dietitian for individual calorie needs. Grains Whole grain or whole wheat bread. Brown rice. Whole grain or whole wheat pasta. Quinoa, bulgur, and whole grain cereals. Low-sodium cereals. Corn or whole wheat flour tortillas. Whole grain cornbread. Whole grain crackers. Low-sodium crackers. Vegetables Fresh or frozen vegetables (raw, steamed, roasted, or grilled). Low-sodium or reduced-sodium tomato and vegetable juices. Low-sodium or reduced-sodium tomato sauce and paste. Low-sodium or reduced-sodium canned vegetables.  Fruits All fresh, canned (in natural juice), or frozen fruits. Meat and Other Protein Products Ground beef (85% or leaner), grass-fed beef, or beef trimmed of fat. Skinless chicken or [place]. Ground chicken or [place]. Pork trimmed of fat. All fish and seafood. Eggs. Dried beans, peas, or lentils. Unsalted nuts and seeds. Unsalted canned beans. Dairy Low-fat dairy products, such as skim or 1% milk, 2% or reduced-fat cheeses, low-fat ricotta or cottage cheese, or plain low-fat yogurt. Low-sodium or reduced-sodium cheeses. Fats and Oils Tub margarines without trans fats. Light or reduced-fat mayonnaise and salad dressings (reduced sodium). Avocado. Safflower, olive, or canola oils. Natural peanut or almond butter. Other Unsalted popcorn and pretzels. The items listed above may not be a complete list of recommended foods or beverages. Contact your dietitian for more options. WHAT FOODS ARE NOT RECOMMENDED? Grains White bread. White pasta. White rice. Refined cornbread. Bagels and croissants. Crackers that contain trans fat. Vegetables Creamed or fried  vegetables. Vegetables in a cheese sauce. Regular canned vegetables. Regular canned tomato sauce and paste. Regular tomato and vegetable juices. Fruits Dried fruits. Canned  fruit in light or heavy syrup. Fruit juice. Meat and Other Protein Products Fatty cuts of meat. Ribs, chicken wings, bacon, sausage, bologna, salami, chitterlings, fatback, hot dogs, bratwurst, and packaged luncheon meats. Salted nuts and seeds. Canned beans with salt. Dairy Whole or 2% milk, cream, half-and-half, and cream cheese. Whole-fat or sweetened yogurt. Full-fat cheeses or blue cheese. Nondairy creamers and whipped toppings. Processed cheese, cheese spreads, or cheese curds. Condiments Onion and garlic salt, seasoned salt, table salt, and sea salt. Canned and packaged gravies. Worcestershire sauce. Tartar sauce. Barbecue sauce. Teriyaki sauce. Soy sauce, including reduced sodium. Steak sauce. Fish sauce. Oyster sauce. Cocktail sauce. Horseradish. Ketchup and mustard. Meat flavorings and tenderizers. Bouillon cubes. Hot sauce. Tabasco sauce. Marinades. Taco seasonings. Relishes. Fats and Oils Butter, stick margarine, lard, shortening, ghee, and bacon fat. Coconut, palm kernel, or palm oils. Regular salad dressings. Other Pickles and olives. Salted popcorn and pretzels. The items listed above may not be a complete list of foods and beverages to avoid. Contact your dietitian for more information. WHERE CAN I FIND MORE INFORMATION? National Heart, Lung, and Blood Institute: travelstabloid.com Document Released: 09/20/2011 Document Revised: 02/15/2014 Document Reviewed: 08/05/2013 [facility] Patient Information 2015 [place], [place]. This information is not intended to replace advice given to you by your health care provider. Make sure you discuss any questions you have with your health care provider.   Smoking Cessation Quitting smoking is important to your health and has many advantages. However, it is not always easy to quit since nicotine is a very addictive drug. Oftentimes, people try 3 times or more before being able to quit. This document explains the best  ways for you to prepare to quit smoking. Quitting takes hard work and a lot of effort, but you can do it. ADVANTAGES OF QUITTING SMOKING  You will live longer, feel better, and live better.  Your body will feel the impact of quitting smoking almost immediately.  Within 20 minutes, blood pressure decreases. Your pulse returns to its normal level.  After 8 hours, carbon monoxide levels in the blood return to normal. Your oxygen level increases.  After 24 hours, the chance of having a heart attack starts to decrease. Your breath, hair, and body stop smelling like smoke.  After 48 hours, damaged nerve endings begin to recover. Your sense of taste and smell improve.  After 72 hours, the body is virtually free of nicotine. Your bronchial tubes relax and breathing becomes easier.  After 2 to 12 weeks, lungs can hold more air. Exercise becomes easier and circulation improves.  The risk of having a heart attack, stroke, cancer, or lung disease is greatly reduced.  After 1 year, the risk of coronary heart disease is cut in half.  After 5 years, the risk of stroke falls to the same as a nonsmoker.  After 10 years, the risk of lung cancer is cut in half and the risk of other cancers decreases significantly.  After 15 years, the risk of coronary heart disease drops, usually to the level of a nonsmoker.  If you are pregnant, quitting smoking will improve your chances of having a healthy baby.  The people you live with, especially any children, will be healthier.  You will have extra money to spend on things other than cigarettes. QUESTIONS TO THINK ABOUT BEFORE ATTEMPTING TO QUIT You may want to  talk about your answers with your health care provider.  Why do you want to quit?  If you tried to quit in the past, what helped and what did not?  What will be the most difficult situations for you after you quit? How will you plan to handle them?  Who can help you through the tough times? Your  family? Friends? A health care provider?  What pleasures do you get from smoking? What ways can you still get pleasure if you quit? Here are some questions to ask your health care provider:  How can you help me to be successful at quitting?  What medicine do you think would be best for me and how should I take it?  What should I do if I need more help?  What is smoking withdrawal like? How can I get information on withdrawal? GET READY  Set a quit date.  Change your environment by getting rid of all cigarettes, ashtrays, matches, and lighters in your home, car, or work. Do not let people smoke in your home.  Review your past attempts to quit. Think about what worked and what did not. GET SUPPORT AND ENCOURAGEMENT You have a better chance of being successful if you have help. You can get support in many ways.  Tell your family, friends, and coworkers that you are going to quit and need their support. Ask them not to smoke around you.  Get individual, group, or telephone counseling and support. Programs are available at [employer] and health centers. Call your local health department for information about programs in your area.  Spiritual beliefs and practices may help some smokers quit.  Download a "quit meter" on your computer to keep track of quit statistics, such as how long you have gone without smoking, cigarettes not smoked, and money saved.  Get a self-help book about quitting smoking and staying off tobacco. [place] yourself from urges to smoke. Talk to someone, go for a walk, or occupy your time with a task.  Change your normal routine. Take a different route to work. Drink tea instead of coffee. Eat breakfast in a different place.  Reduce your stress. Take a hot bath, exercise, or read a book.  Plan something enjoyable to do every day. Reward yourself for not smoking.  Explore interactive web-based programs that specialize in  helping you quit. GET MEDICINE AND USE IT CORRECTLY Medicines can help you stop smoking and decrease the urge to smoke. Combining medicine with the above behavioral methods and support can greatly increase your chances of successfully quitting smoking.  Nicotine replacement therapy helps deliver nicotine to your body without the negative effects and risks of smoking. Nicotine replacement therapy includes nicotine gum, lozenges, inhalers, nasal sprays, and skin patches. Some may be available over-the-counter and others require a prescription.  Antidepressant medicine helps people abstain from smoking, but how this works is unknown. This medicine is available by prescription.  Nicotinic receptor partial agonist medicine simulates the effect of nicotine in your brain. This medicine is available by prescription. Ask your health care provider for advice about which medicines to use and how to use them based on your health history. Your health care provider will tell you what side effects to look out for if you choose to be on a medicine or therapy. Carefully read the information on the package. Do not use any other product containing nicotine while using a nicotine replacement product.  RELAPSE OR DIFFICULT SITUATIONS  Most relapses occur within the first 3 months after quitting. Do not be discouraged if you start smoking again. Remember, most people try several times before finally quitting. You may have symptoms of withdrawal because your body is used to nicotine. You may crave cigarettes, be irritable, feel very hungry, cough often, get headaches, or have difficulty concentrating. The withdrawal symptoms are only temporary. They are strongest when you first quit, but they will go away within 10-14 days. To reduce the chances of relapse, try to:  Avoid drinking alcohol. Drinking lowers your chances of successfully quitting.  Reduce the amount of caffeine you consume. Once you quit smoking, the amount of  caffeine in your body increases and can give you symptoms, such as a rapid heartbeat, sweating, and anxiety.  Avoid smokers because they can make you want to smoke.  Do not let weight gain distract you. Many smokers will gain weight when they quit, usually less than 10 pounds. Eat a healthy diet and stay active. You can always lose the weight gained after you quit.  Find ways to improve your mood other than smoking. FOR MORE INFORMATION  www.smokefree.gov  Document Released: 09/25/2001 Document Revised: 02/15/2014 Document Reviewed: 01/10/2012 [facility] Patient Information 2015 [place], [place]. This information is not intended to replace advice given to you by your health care provider. Make sure you discuss any questions you have with your health care provider.

## 2015-07-13 NOTE — Assessment & Plan Note (Signed)
Family history of diabetes and noted increased frequency of urination and hunger lately. Obtain A1c to rule out diabetes. Discussed importance of adequate nutrition with nutrient dense foods and avoiding processed/sugary foods, increasing physical activity to most days of the week. Follow-up pending A1c results.

## 2015-07-13 NOTE — Assessment & Plan Note (Signed)
Ovarian cyst confirmed with CT scan or recent emergency room visit. Continues to experience lower abdominal pain which is consistent with the CT findings. Recommend follow-up with gynecology for further management of the ovarian cyst. Continue over-the-counter medication as needed for symptom relief and supportive care.

## 2015-12-02 ENCOUNTER — Ambulatory Visit: Payer: BLUE CROSS/BLUE SHIELD

## 2015-12-15 ENCOUNTER — Encounter: Payer: Self-pay | Admitting: Certified Nurse Midwife

## 2015-12-15 ENCOUNTER — Ambulatory Visit (INDEPENDENT_AMBULATORY_CARE_PROVIDER_SITE_OTHER): Payer: BLUE CROSS/BLUE SHIELD | Admitting: Certified Nurse Midwife

## 2015-12-15 ENCOUNTER — Ambulatory Visit: Payer: BLUE CROSS/BLUE SHIELD

## 2015-12-15 VITALS — BP 171/114 | HR 66 | Ht 62.0 in | Wt 157.0 lb

## 2015-12-15 DIAGNOSIS — Z01419 Encounter for gynecological examination (general) (routine) without abnormal findings: Secondary | ICD-10-CM

## 2015-12-15 DIAGNOSIS — Z113 Encounter for screening for infections with a predominantly sexual mode of transmission: Secondary | ICD-10-CM

## 2015-12-15 LAB — CBC WITH DIFFERENTIAL/PLATELET
Basophils Absolute: 0 10*3/uL (ref 0.0–0.1)
Basophils Relative: 0 % (ref 0–1)
Eosinophils Absolute: 0.1 10*3/uL (ref 0.0–0.7)
Eosinophils Relative: 1 % (ref 0–5)
HEMATOCRIT: 38.6 % (ref 36.0–46.0)
HEMOGLOBIN: 12.5 g/dL (ref 12.0–15.0)
LYMPHS ABS: 2.7 10*3/uL (ref 0.7–4.0)
Lymphocytes Relative: 35 % (ref 12–46)
MCH: 26.9 pg (ref 26.0–34.0)
MCHC: 32.4 g/dL (ref 30.0–36.0)
MCV: 83.2 fL (ref 78.0–100.0)
MONOS PCT: 6 % (ref 3–12)
MPV: 10.2 fL (ref 8.6–12.4)
Monocytes Absolute: 0.5 10*3/uL (ref 0.1–1.0)
NEUTROS ABS: 4.5 10*3/uL (ref 1.7–7.7)
NEUTROS PCT: 58 % (ref 43–77)
Platelets: 347 10*3/uL (ref 150–400)
RBC: 4.64 MIL/uL (ref 3.87–5.11)
RDW: 14.7 % (ref 11.5–15.5)
WBC: 7.7 10*3/uL (ref 4.0–10.5)

## 2015-12-15 LAB — COMPREHENSIVE METABOLIC PANEL
ALBUMIN: 4 g/dL (ref 3.6–5.1)
ALT: 11 U/L (ref 6–29)
AST: 15 U/L (ref 10–35)
Alkaline Phosphatase: 73 U/L (ref 33–130)
BUN: 15 mg/dL (ref 7–25)
CHLORIDE: 101 mmol/L (ref 98–110)
CO2: 27 mmol/L (ref 20–31)
CREATININE: 1.03 mg/dL (ref 0.50–1.05)
Calcium: 9.4 mg/dL (ref 8.6–10.4)
Glucose, Bld: 91 mg/dL (ref 65–99)
POTASSIUM: 4.1 mmol/L (ref 3.5–5.3)
Sodium: 140 mmol/L (ref 135–146)
TOTAL PROTEIN: 7.9 g/dL (ref 6.1–8.1)
Total Bilirubin: 0.4 mg/dL (ref 0.2–1.2)

## 2015-12-15 LAB — HEPATITIS B SURFACE ANTIGEN: Hepatitis B Surface Ag: NEGATIVE

## 2015-12-15 LAB — HEPATITIS C ANTIBODY: HCV Ab: NEGATIVE

## 2015-12-15 LAB — TSH: TSH: 0.54 m[IU]/L

## 2015-12-15 LAB — HIV ANTIBODY (ROUTINE TESTING W REFLEX): HIV 1&2 Ab, 4th Generation: NONREACTIVE

## 2015-12-15 NOTE — Progress Notes (Signed)
Patient ID: [name], female   DOB: [birth year], 50 y.o.   MRN: [id number]    Subjective:        [name] is a 51 y.o. female here for a routine exam.  Current complaints: none.  Has elevated blood pressure today, has PCP she sees.  Has lost weight recently.  Denies any symptoms of HA, dizziness.  Reports working 3rd shift and stress.   Had a hysterectomy for fibroids.  Is exercising.  Currently sexually active, 1 partner.    Smoker about 1/2 pack/day.  Had quit years ago and was stopped for 7 years.  Divorced.      Personal health questionnaire:  Is patient Ashkenazi Jewish, have a family history of breast and/or ovarian cancer: no Is there a family history of uterine cancer diagnosed at age < 14, gastrointestinal cancer, urinary tract cancer, family member who is a Field seismologist syndrome-associated carrier: no Is the patient overweight and hypertensive, family history of diabetes, personal history of gestational diabetes, preeclampsia or PCOS: yes Is patient over 50, have PCOS,  family history of premature CHD under age 74, diabetes, smoke, have hypertension or peripheral artery disease:  yes At any time, has a partner hit, kicked or otherwise hurt or frightened you?: no Over the past 2 weeks, have you felt down, depressed or hopeless?: no Over the past 2 weeks, have you felt little interest or pleasure in doing things?:no   Gynecologic History No LMP recorded. Patient has had a hysterectomy. Contraception: status post hysterectomy Last Pap: 2 years ago. Results were: normal Last mammogram: unknow. Results were: normal mammograms according to the patient  Obstetric History OB History  Gravida Para Term Preterm AB SAB TAB Ectopic Multiple Living  4 3   1 1    3     # Outcome Date GA Lbr Len/2nd Weight Sex Delivery Anes PTL Lv  4 SAB           3 Para      Vag-Spont     2 Para      Vag-Spont     1 Para      Vag-Spont         Past Medical History  Diagnosis Date  .  Hypertension   . Hyperlipidemia   . Scalp mass 02/25/2012  . Yeast infection   . Bacterial infection   . Chlamydia   . Migraine   . Fibroids   . Anemia     iron deficiency    Past Surgical History  Procedure Laterality Date  . Cervix cerclage  1987  . Tubal ligation    . Wisdom tooth extraction    . Total abdominal hysterectomy       Current outpatient prescriptions:  .  lisinopril-hydrochlorothiazide (PRINZIDE,ZESTORETIC) 20-25 MG tablet, Take 1 tablet by mouth daily., Disp: 30 tablet, Rfl: 0 Allergies  Allergen Reactions  . Tylox [Oxycodone-Acetaminophen] Itching    Social History  Substance Use Topics  . Smoking status: Current Some Day Smoker    Types: Cigars, Cigarettes  . Smokeless tobacco: Never Used  . Alcohol Use: 0.6 oz/week    1 Cans of beer per week     Comment: occ    Family History  Problem Relation Age of Onset  . Heart disease Father   . Hypertension Father   . Stroke Father   . Hypertension Mother   . Multiple sclerosis Mother   . Hypertension Brother   . Asthma Sister   . Emphysema Sister   .  Lung cancer Sister   . Sickle cell trait Daughter   . Cancer Maternal Grandmother   . Diabetes Maternal Grandfather   . Healthy Paternal Grandmother       Review of Systems  Constitutional: negative for fatigue and weight loss Respiratory: negative for cough and wheezing Cardiovascular: negative for chest pain, fatigue and palpitations Gastrointestinal: negative for abdominal pain and change in bowel habits Musculoskeletal:negative for myalgias Neurological: negative for gait problems and tremors Behavioral/Psych: negative for abusive relationship, depression Endocrine: negative for temperature intolerance   Genitourinary:negative for abnormal menstrual periods, genital lesions, hot flashes, sexual problems and vaginal discharge Integument/breast: negative for breast lump, breast tenderness, nipple discharge and skin lesion(s)    Objective:        BP 171/114 mmHg  Pulse 66  Ht 5\' 2"  (1.575 m)  Wt 157 lb (71.215 kg)  BMI 28.71 kg/m2 General:   alert  Skin:   no rash or abnormalities  Lungs:   clear to auscultation bilaterally  Heart:   regular rate and rhythm, S1, S2 normal, no murmur, click, rub or gallop  Breasts:   normal without suspicious masses, skin or nipple changes or axillary nodes  Abdomen:  normal findings: no organomegaly, soft, non-tender and no hernia  Pelvis:  External genitalia: normal general appearance Urinary system: urethral meatus normal and bladder without fullness, nontender Vaginal: normal without tenderness, induration or masses Cervix: cuff Adnexa: normal bimanual exam Uterus: surgically absent   Lab Review Urine pregnancy test Labs reviewed yes Radiologic studies reviewed yes  50% of 30 min visit spent on counseling and coordination of care.   Assessment:    Healthy female exam.   H/O of hysterectomy STD screening exam  Plan:   Mammogram is scheduled for later this month.    Education reviewed: calcium supplements, depression evaluation, low fat, low cholesterol diet, safe sex/STD prevention, self breast exams, skin cancer screening and weight bearing exercise. Contraception: status post hysterectomy. Follow up in: 1 year.   No orders of the defined types were placed in this encounter.   Orders Placed This Encounter  Procedures  . SureSwab, Vaginosis/Vaginitis Plus  . HIV antibody (with reflex)  . Hepatitis B surface antigen  . RPR  . Hepatitis C antibody  . CBC with Differential/Platelet  . Comprehensive metabolic panel  . TSH   Need to obtain previous records

## 2015-12-16 LAB — PAP IG (IMAGE GUIDED)

## 2015-12-16 LAB — RPR

## 2015-12-19 LAB — SURESWAB, VAGINOSIS/VAGINITIS PLUS
ATOPOBIUM VAGINAE: 5.4 Log (cells/mL)
C. ALBICANS, DNA: NOT DETECTED
C. TRACHOMATIS RNA, TMA: NOT DETECTED
C. glabrata, DNA: NOT DETECTED
C. parapsilosis, DNA: NOT DETECTED
C. tropicalis, DNA: NOT DETECTED
LACTOBACILLUS SPECIES: NOT DETECTED Log (cells/mL)
MEGASPHAERA SPECIES: 7.6 Log (cells/mL)
N. gonorrhoeae RNA, TMA: NOT DETECTED
T. VAGINALIS RNA, QL TMA: NOT DETECTED

## 2015-12-21 ENCOUNTER — Other Ambulatory Visit: Payer: Self-pay | Admitting: Certified Nurse Midwife

## 2015-12-21 DIAGNOSIS — N760 Acute vaginitis: Principal | ICD-10-CM

## 2015-12-21 DIAGNOSIS — B9689 Other specified bacterial agents as the cause of diseases classified elsewhere: Secondary | ICD-10-CM

## 2015-12-21 MED ORDER — METRONIDAZOLE 500 MG PO TABS: 500.0000 mg | ORAL_TABLET | Freq: Two times a day (BID) | ORAL | Status: DC

## 2015-12-26 ENCOUNTER — Inpatient Hospital Stay: Admission: RE | Admit: 2015-12-26 | Payer: BLUE CROSS/BLUE SHIELD | Source: Ambulatory Visit

## 2016-04-30 ENCOUNTER — Ambulatory Visit (INDEPENDENT_AMBULATORY_CARE_PROVIDER_SITE_OTHER): Payer: BLUE CROSS/BLUE SHIELD

## 2016-04-30 ENCOUNTER — Ambulatory Visit (HOSPITAL_COMMUNITY)
Admission: EM | Admit: 2016-04-30 | Discharge: 2016-04-30 | Disposition: A | Discharge status: Home / Self Care | Payer: BLUE CROSS/BLUE SHIELD | Attending: Family Medicine | Admitting: Family Medicine

## 2016-04-30 ENCOUNTER — Encounter (HOSPITAL_COMMUNITY): Payer: Self-pay | Admitting: Family Medicine

## 2016-04-30 DIAGNOSIS — M25512 Pain in left shoulder: Secondary | ICD-10-CM | POA: Diagnosis not present

## 2016-04-30 NOTE — ED Notes (Signed)
Pt here for 2 weeks of left upper arm pain radiating into her neck. sts aching feeling. Denies any chest pain, SOB. Denies any injury. sts her BP has been elevated.

## 2016-04-30 NOTE — ED Provider Notes (Signed)
CSN: [id number]     Arrival date & time 04/30/16  1338 History   First MD Initiated Contact with Patient 04/30/16 1548     Chief Complaint  Patient presents with  . Arm Pain  . Neck Pain   (Consider location/radiation/quality/duration/timing/severity/associated sxs/prior Treatment) HPI History obtained from patient:  Pt presents with the cc of:  Left shoulder pain Duration of symptoms: 2 months Treatment prior to arrival: None Context: Onset of pain while at work. Patient states that she works for [employer] does not do any heavy lifting but does repetitious work. Other symptoms include: None Pain score: 3 FAMILY HISTORY: Hypertension, diabetes    Past Medical History  Diagnosis Date  . Hypertension   . Hyperlipidemia   . Scalp mass 02/25/2012  . Yeast infection   . Bacterial infection   . Chlamydia   . Migraine   . Fibroids   . Anemia     iron deficiency   Past Surgical History  Procedure Laterality Date  . Cervix cerclage  1987  . Tubal ligation    . Wisdom tooth extraction    . Total abdominal hysterectomy     Family History  Problem Relation Age of Onset  . Heart disease Father   . Hypertension Father   . Stroke Father   . Hypertension Mother   . Multiple sclerosis Mother   . Hypertension Brother   . Asthma Sister   . Emphysema Sister   . Lung cancer Sister   . Sickle cell trait Daughter   . Cancer Maternal Grandmother   . Diabetes Maternal Grandfather   . Healthy Paternal Grandmother    Social History  Substance Use Topics  . Smoking status: Current Some Day Smoker    Types: Cigars, Cigarettes  . Smokeless tobacco: Never Used  . Alcohol Use: 0.6 oz/week    1 Cans of beer per week     Comment: occ   OB History    Gravida Para Term Preterm AB TAB SAB Ectopic Multiple Living   4 3   1  1   3      Review of Systems  Denies: HEADACHE, NAUSEA, ABDOMINAL PAIN, CHEST PAIN, CONGESTION, DYSURIA, SHORTNESS OF BREATH  Tylox  Home Medications    Prior to Admission medications   Medication Sig Start Date End Date Taking? Authorizing Provider  lisinopril-hydrochlorothiazide (PRINZIDE,ZESTORETIC) 20-25 MG tablet Take 1 tablet by mouth daily. 07/13/15   Golden Circle, FNP  metroNIDAZOLE (FLAGYL) 500 MG tablet Take 1 tablet (500 mg total) by mouth 2 (two) times daily. 12/21/15   Morene Crocker, CNM   Meds Ordered and Administered this Visit  Medications - No data to display  Pulse 66  Temp(Src) 98.8 F (37.1 C) (Oral)  Resp 12  SpO2 100% No data found.   Physical Exam NURSES NOTES AND VITAL SIGNS REVIEWED. CONSTITUTIONAL: Well developed, well nourished, no acute distress HEENT: normocephalic, atraumatic EYES: Conjunctiva normal NECK:normal ROM, supple, no adenopathy PULMONARY:No respiratory distress, normal effort ABDOMINAL: Soft, ND, NT BS+, No CVAT MUSCULOSKELETAL: Normal ROM of all extremities, shoulder there is some mild tenderness noted in the anterior portion of the shoulder. Range of motion is fairly good crepitus. No acromioclavicular joint tenderness. SKIN: warm and dry without rash PSYCHIATRIC: Mood and affect, behavior are normal  ED Course  Procedures (including critical care time)  Labs Review Labs Reviewed - No data to display  Imaging Review Dg Shoulder Left  04/30/2016  CLINICAL DATA:  Left shoulder and neck  pain for 1 month. No known injury. EXAM: LEFT SHOULDER - 2+ VIEW COMPARISON:  None. FINDINGS: The mineralization and alignment are normal. There is no evidence of acute fracture or dislocation. The subacromial space is preserved. There are mild acromioclavicular degenerative changes. IMPRESSION: No acute osseous findings. Mild acromioclavicular degenerative changes. Electronically Signed   By: Richardean Sale M.D.   On: 04/30/2016 16:44   Discussed with patient prior to discharge  Visual Acuity Review  Right Eye Distance:   Left Eye Distance:   Bilateral Distance:    Right Eye Near:   Left  Eye Near:    Bilateral Near:      Referral to [facility] orthopedics for further evaluation of left shoulder pain.   MDM   1. Shoulder pain, acute, left     Patient is reassured that there are no issues that require transfer to higher level of care at this time or additional tests. Patient is advised to continue home symptomatic treatment. Patient is advised that if there are new or worsening symptoms to attend the emergency department, contact primary care provider, or return to UC. Instructions of care provided discharged home in stable condition.    THIS NOTE WAS GENERATED USING A VOICE RECOGNITION SOFTWARE PROGRAM. ALL REASONABLE EFFORTS  WERE MADE TO PROOFREAD THIS DOCUMENT FOR ACCURACY.  I have verbally reviewed the discharge instructions with the patient. A printed AVS was given to the patient.  All questions were answered prior to discharge.      Konrad Felix, PA 04/30/16 2029

## 2016-04-30 NOTE — Discharge Instructions (Signed)
Shoulder Pain The shoulder is the joint that connects your arm to your body. Muscles and band-like tissues that connect bones to muscles (tendons) hold the joint together. Shoulder pain is felt if an injury or medical problem affects one or more parts of the shoulder. HOME CARE   Put ice on the sore area.  Put ice in a plastic bag.  Place a towel between your skin and the bag.  Leave the ice on for 15-20 minutes, 03-04 times a day for the first 2 days.  Stop using cold packs if they do not help with the pain.  If you were given something to keep your shoulder from moving (sling; shoulder immobilizer), wear it as told. Only take it off to shower or bathe.  Move your arm as little as possible, but keep your hand moving to prevent puffiness (swelling).  Squeeze a soft ball or foam pad as much as possible to help prevent swelling.  Take medicine as told by your doctor. GET HELP IF:  You have progressing new pain in your arm, hand, or fingers.  Your hand or fingers get cold.  Your medicine does not help lessen your pain. GET HELP RIGHT AWAY IF:   Your arm, hand, or fingers are numb or tingling.  Your arm, hand, or fingers are puffy (swollen), painful, or turn white or blue. MAKE SURE YOU:   Understand these instructions.  Will watch your condition.  Will get help right away if you are not doing well or get worse.   This information is not intended to replace advice given to you by your health care provider. Make sure you discuss any questions you have with your health care provider.   Document Released: 03/19/2008 Document Revised: 10/22/2014 Document Reviewed: 01/24/2015 Elsevier Interactive Patient Education 2016 [place] therapy can help ease sore, stiff, injured, and tight muscles and joints. Heat relaxes your muscles, which may help ease your pain.  RISKS AND COMPLICATIONS If you have any of the following conditions, do not use heat therapy  unless your health care provider has approved:  Poor circulation.  Healing wounds or scarred skin in the area being treated.  Diabetes, heart disease, or high blood pressure.  Not being able to feel (numbness) the area being treated.  Unusual swelling of the area being treated.  Active infections.  Blood clots.  Cancer.  Inability to communicate pain. This may include young children and people who have problems with their brain function (dementia).  Pregnancy. Heat therapy should only be used on old, pre-existing, or long-lasting (chronic) injuries. Do not use heat therapy on new injuries unless directed by your health care provider. HOW TO USE HEAT THERAPY There are several different kinds of heat therapy, including:  Moist heat pack.  Warm water bath.  Hot water bottle.  Electric heating pad.  Heated gel pack.  Heated wrap.  Electric heating pad. Use the heat therapy method suggested by your health care provider. Follow your health care provider's instructions on when and how to use heat therapy. GENERAL HEAT THERAPY RECOMMENDATIONS  Do not sleep while using heat therapy. Only use heat therapy while you are awake.  Your skin may turn pink while using heat therapy. Do not use heat therapy if your skin turns red.  Do not use heat therapy if you have new pain.  High heat or long exposure to heat can cause burns. Be careful when using heat therapy to avoid burning your skin.  Do not use heat therapy on areas of your skin that are already irritated, such as with a rash or sunburn. SEEK MEDICAL CARE IF:  You have blisters, redness, swelling, or numbness.  You have new pain.  Your pain is worse. MAKE SURE YOU:  Understand these instructions.  Will watch your condition.  Will get help right away if you are not doing well or get worse.   This information is not intended to replace advice given to you by your health care provider. Make sure you discuss any  questions you have with your health care provider.   Document Released: 12/24/2011 Document Revised: 10/22/2014 Document Reviewed: 11/24/2013 Elsevier Interactive Patient Education [employer].

## 2016-07-13 ENCOUNTER — Encounter (HOSPITAL_COMMUNITY): Payer: Self-pay | Admitting: *Deleted

## 2016-07-13 ENCOUNTER — Ambulatory Visit (HOSPITAL_COMMUNITY)
Admission: EM | Admit: 2016-07-13 | Discharge: 2016-07-13 | Disposition: A | Discharge status: Home / Self Care | Payer: BLUE CROSS/BLUE SHIELD | Attending: Family Medicine | Admitting: Family Medicine

## 2016-07-13 DIAGNOSIS — R102 Pelvic and perineal pain: Secondary | ICD-10-CM

## 2016-07-13 LAB — POCT URINALYSIS DIP (DEVICE)
BILIRUBIN URINE: NEGATIVE
Glucose, UA: NEGATIVE mg/dL
Hgb urine dipstick: NEGATIVE
KETONES UR: NEGATIVE mg/dL
Leukocytes, UA: NEGATIVE
Nitrite: NEGATIVE
PH: 6 (ref 5.0–8.0)
PROTEIN: 100 mg/dL — AB
SPECIFIC GRAVITY, URINE: 1.025 (ref 1.005–1.030)
Urobilinogen, UA: 0.2 mg/dL (ref 0.0–1.0)

## 2016-07-13 MED ORDER — TERCONAZOLE 80 MG VA SUPP: 80.0000 mg | Freq: Every day | VAGINAL | 0 refills | Status: DC

## 2016-07-13 MED ORDER — FLUCONAZOLE 150 MG PO TABS: 150.0000 mg | ORAL_TABLET | Freq: Once | ORAL | 1 refills | Status: AC

## 2016-07-13 NOTE — ED Provider Notes (Signed)
[place]    CSN: [id number] Arrival date & time: 07/13/16  1533     History   Chief Complaint Chief Complaint  Patient presents with  . Abdominal Pain    HPI [name] is a 50 y.o. female.   The history is provided by the patient.  Abdominal Pain  Pain location:  Suprapubic Pain quality: sharp   Pain radiates to:  Does not radiate Pain severity:  Mild Onset quality:  Gradual Duration:  1 week Progression:  Unchanged Chronicity:  New Context: previous surgery and recent sexual activity   Context comment:  S/p hyst, ovaries intact. Relieved by:  Nothing Worsened by:  Nothing Ineffective treatments:  OTC medications Associated symptoms: no anorexia, no chills, no diarrhea, no dysuria, no fever, no vaginal discharge and no vomiting     Past Medical History:  Diagnosis Date  . Anemia    iron deficiency  . Bacterial infection   . Chlamydia   . Fibroids   . Hyperlipidemia   . Hypertension   . Migraine   . Scalp mass 02/25/2012  . Yeast infection     Patient Active Problem List   Diagnosis Date Noted  . Essential hypertension 07/13/2015  . Urinary frequency 07/13/2015  . Ovarian cyst 07/13/2015  . Chlamydia   . Fibroids   . Scalp mass 02/25/2012    Past Surgical History:  Procedure Laterality Date  . cervix cerclage  1987  . TOTAL ABDOMINAL HYSTERECTOMY    . TUBAL LIGATION    . WISDOM TOOTH EXTRACTION      OB History    Gravida Para Term Preterm AB Living   4 3     1 3    SAB TAB Ectopic Multiple Live Births   1               Home Medications    Prior to Admission medications   Medication Sig Start Date End Date Taking? Authorizing Provider  fluconazole (DIFLUCAN) 150 MG tablet Take 1 tablet (150 mg total) by mouth once. 07/13/16 07/13/16  Billy Fischer, MD  lisinopril-hydrochlorothiazide (PRINZIDE,ZESTORETIC) 20-25 MG tablet Take 1 tablet by mouth daily. 07/13/15   Golden Circle, FNP  metroNIDAZOLE (FLAGYL) 500 MG  tablet Take 1 tablet (500 mg total) by mouth 2 (two) times daily. 12/21/15   Rachelle A Denney, CNM  terconazole (TERAZOL 3) 80 MG vaginal suppository Place 1 suppository (80 mg total) vaginally at bedtime. 07/13/16   Billy Fischer, MD    Family History Family History  Problem Relation Age of Onset  . Heart disease Father   . Hypertension Father   . Stroke Father   . Hypertension Mother   . Multiple sclerosis Mother   . Cancer Maternal Grandmother   . Diabetes Maternal Grandfather   . Healthy Paternal Grandmother   . Hypertension Brother   . Asthma Sister   . Emphysema Sister   . Lung cancer Sister   . Sickle cell trait Daughter     Social History Social History  Substance Use Topics  . Smoking status: Current Some Day Smoker    Types: Cigars, Cigarettes  . Smokeless tobacco: Never Used  . Alcohol use 0.6 oz/week    1 Cans of beer per week     Comment: occ     Allergies   Tylox [oxycodone-acetaminophen]   Review of Systems Review of Systems  Constitutional: Negative for chills and fever.  Gastrointestinal: Positive for abdominal pain. Negative for anorexia,  diarrhea and vomiting.  Genitourinary: Positive for pelvic pain. Negative for dysuria, vaginal discharge and vaginal pain.  All other systems reviewed and are negative.    Physical Exam Triage Vital Signs ED Triage Vitals  Enc Vitals Group     BP 07/13/16 1605 187/88     Pulse Rate 07/13/16 1605 74     Resp 07/13/16 1605 12     Temp 07/13/16 1605 98.4 F (36.9 C)     Temp src --      SpO2 07/13/16 1605 100 %     Weight --      Height --      Head Circumference --      Peak Flow --      Pain Score 07/13/16 1655 6     Pain Loc --      Pain Edu? --      Excl. in [place]? --    No data found.   Updated Vital Signs BP 187/88 (BP Location: Left Arm)   Pulse 74   Temp 98.4 F (36.9 C)   Resp 12   SpO2 100%   Visual Acuity Right Eye Distance:   Left Eye Distance:   Bilateral Distance:    Right Eye  Near:   Left Eye Near:    Bilateral Near:     Physical Exam  Constitutional: She appears well-developed and well-nourished. No distress.  Abdominal: She exhibits no mass. There is tenderness. There is no rebound and no guarding. No hernia.  Genitourinary: Vaginal discharge found.  Genitourinary Comments: Smooth white mild d/c. No odor , no lesions or bleeding.  Skin: Skin is warm and dry.  Nursing note and vitals reviewed.    UC Treatments / Results  Labs (all labs ordered are listed, but only abnormal results are displayed) Labs Reviewed  POCT URINALYSIS DIP (DEVICE) - Abnormal; Notable for the following:       Result Value   Protein, ur 100 (*)    All other components within normal limits    EKG  EKG Interpretation None       Radiology No results found.  Procedures Procedures (including critical care time)  Medications Ordered in UC Medications - No data to display   Initial Impression / Assessment and Plan / UC Course  I have reviewed the triage vital signs and the nursing notes.  Pertinent labs & imaging results that were available during my care of the patient were reviewed by me and considered in my medical decision making (see chart for details).  Clinical Course    Lab tech present as chaparone and when pt got upset that we didn't know what was wrong with her.  Final Clinical Impressions(s) / UC Diagnoses   Final diagnoses:  Pelvic pain in female    New Prescriptions New Prescriptions   FLUCONAZOLE (DIFLUCAN) 150 MG TABLET    Take 1 tablet (150 mg total) by mouth once.   TERCONAZOLE (TERAZOL 3) 80 MG VAGINAL SUPPOSITORY    Place 1 suppository (80 mg total) vaginally at bedtime.     Billy Fischer, MD 07/13/16 [phone]

## 2016-07-13 NOTE — Discharge Instructions (Signed)
Use medicine as prescribed and see your doctor on mon for further eval or go to [place] if pain gets worse beforehand.

## 2016-07-13 NOTE — ED Triage Notes (Signed)
Pt  Reports   Symptoms  Of  Lower  Abdominal  Pain    With  Irritation  To    Perineal  Are         X  1  Week  Pt  Ambulated  To  Room  With a  Steady  Fluid  Upright  Gait

## 2017-02-08 DIAGNOSIS — M722 Plantar fascial fibromatosis: Secondary | ICD-10-CM | POA: Insufficient documentation

## 2018-03-05 ENCOUNTER — Other Ambulatory Visit: Payer: Self-pay

## 2018-03-05 ENCOUNTER — Emergency Department (HOSPITAL_BASED_OUTPATIENT_CLINIC_OR_DEPARTMENT_OTHER)
Admission: EM | Admit: 2018-03-05 | Discharge: 2018-03-05 | Disposition: A | Discharge status: Home / Self Care | Payer: BLUE CROSS/BLUE SHIELD | Attending: Emergency Medicine | Admitting: Emergency Medicine

## 2018-03-05 ENCOUNTER — Emergency Department (HOSPITAL_BASED_OUTPATIENT_CLINIC_OR_DEPARTMENT_OTHER): Payer: BLUE CROSS/BLUE SHIELD

## 2018-03-05 ENCOUNTER — Encounter (HOSPITAL_BASED_OUTPATIENT_CLINIC_OR_DEPARTMENT_OTHER): Payer: Self-pay

## 2018-03-05 DIAGNOSIS — J069 Acute upper respiratory infection, unspecified: Secondary | ICD-10-CM

## 2018-03-05 DIAGNOSIS — B9789 Other viral agents as the cause of diseases classified elsewhere: Secondary | ICD-10-CM | POA: Insufficient documentation

## 2018-03-05 DIAGNOSIS — I10 Essential (primary) hypertension: Secondary | ICD-10-CM

## 2018-03-05 DIAGNOSIS — F17200 Nicotine dependence, unspecified, uncomplicated: Secondary | ICD-10-CM | POA: Diagnosis not present

## 2018-03-05 DIAGNOSIS — Z79899 Other long term (current) drug therapy: Secondary | ICD-10-CM | POA: Diagnosis not present

## 2018-03-05 DIAGNOSIS — F17210 Nicotine dependence, cigarettes, uncomplicated: Secondary | ICD-10-CM | POA: Insufficient documentation

## 2018-03-05 DIAGNOSIS — R05 Cough: Secondary | ICD-10-CM | POA: Diagnosis not present

## 2018-03-05 DIAGNOSIS — Z720 Tobacco use: Secondary | ICD-10-CM

## 2018-03-05 NOTE — ED Provider Notes (Signed)
[facility] EMERGENCY DEPARTMENT Provider Note   CSN: [id number] Arrival date & time: 03/05/18  1220     History   Chief Complaint Chief Complaint  Patient presents with  . Cough    HPI [name] is a 52 y.o. female with a PMHx of HTN, migraines, anemia, and other conditions listed below, who presents to the ED with complaints of URI symptoms for the last week.  She states that initially she started out having sneezing and rhinorrhea, although those have improved but she continues to have a cough with yellow sputum production, fatigue, chills, and over the weekend she had a fever of 101.0 on Saturday and Sunday which then resolved.  She has been taking TheraFlu, Claritin, ibuprofen, and cough drops with no relief of her symptoms.  No known aggravating factors.  No known sick contacts.  Patient admits that she is a cigarette smoker.  She denies any ear pain or drainage, sore throat, CP, SOB, abd pain, N/V/D/C, hematuria, dysuria, myalgias, arthralgias, numbness, tingling, focal weakness, or any other complaints at this time.   The history is provided by the patient and medical records. No language interpreter was used.    Past Medical History:  Diagnosis Date  . Anemia    iron deficiency  . Bacterial infection   . Chlamydia   . Fibroids   . Hypertension   . Migraine   . Scalp mass 02/25/2012  . Yeast infection     Patient Active Problem List   Diagnosis Date Noted  . Essential hypertension 07/13/2015  . Urinary frequency 07/13/2015  . Ovarian cyst 07/13/2015  . Chlamydia   . Fibroids   . Scalp mass 02/25/2012    Past Surgical History:  Procedure Laterality Date  . cervix cerclage  1987  . TOTAL ABDOMINAL HYSTERECTOMY    . TUBAL LIGATION    . WISDOM TOOTH EXTRACTION       OB History    Gravida  4   Para  3   Term      Preterm      AB  1   Living  3     SAB  1   TAB      Ectopic      Multiple      Live Births                Home Medications    Prior to Admission medications   Medication Sig Start Date End Date Taking? Authorizing Provider  lisinopril-hydrochlorothiazide (PRINZIDE,ZESTORETIC) 20-25 MG tablet Take 1 tablet by mouth daily. 07/13/15   Golden Circle, FNP  metroNIDAZOLE (FLAGYL) 500 MG tablet Take 1 tablet (500 mg total) by mouth 2 (two) times daily. 12/21/15   Kandis Cocking A, CNM  terconazole (TERAZOL 3) 80 MG vaginal suppository Place 1 suppository (80 mg total) vaginally at bedtime. 07/13/16   Billy Fischer, MD    Family History Family History  Problem Relation Age of Onset  . Heart disease Father   . Hypertension Father   . Stroke Father   . Hypertension Mother   . Multiple sclerosis Mother   . Cancer Maternal Grandmother   . Diabetes Maternal Grandfather   . Healthy Paternal Grandmother   . Hypertension Brother   . Asthma Sister   . Emphysema Sister   . Lung cancer Sister   . Sickle cell trait Daughter     Social History Social History   Tobacco Use  . Smoking status: Current  Every Day Smoker    Types: Cigarettes  . Smokeless tobacco: Never Used  Substance Use Topics  . Alcohol use: Yes    Comment: occ  . Drug use: No     Allergies   Tylox [oxycodone-acetaminophen]   Review of Systems Review of Systems  Constitutional: Positive for chills, fatigue and fever.  HENT: Positive for rhinorrhea and sneezing. Negative for ear discharge, ear pain and sore throat.   Respiratory: Positive for cough. Negative for shortness of breath.   Cardiovascular: Negative for chest pain.  Gastrointestinal: Negative for abdominal pain, constipation, diarrhea, nausea and vomiting.  Genitourinary: Negative for dysuria and hematuria.  Musculoskeletal: Negative for arthralgias and myalgias.  Skin: Negative for color change.  Allergic/Immunologic: Negative for immunocompromised state.  Neurological: Negative for weakness and numbness.  Psychiatric/Behavioral: Negative for  confusion.   All other systems reviewed and are negative for acute change except as noted in the HPI.    Physical Exam Updated Vital Signs BP (!) 164/95 (BP Location: Left Arm)   Pulse 93   Temp 98.3 F (36.8 C) (Oral)   Resp 20   Ht 5\' 2"  (1.575 m)   Wt 77.1 kg (170 lb)   SpO2 99%   BMI 31.09 kg/m   Physical Exam  Constitutional: She is oriented to person, place, and time. Vital signs are normal. She appears well-developed and well-nourished.  Non-toxic appearance. No distress.  Afebrile, nontoxic, NAD. HTN noted but similar to prior visits.   HENT:  Head: Normocephalic and atraumatic.  Nose: Mucosal edema present.  Mouth/Throat: Uvula is midline, oropharynx is clear and moist and mucous membranes are normal. No trismus in the jaw. No uvula swelling. Tonsils are 0 on the right. Tonsils are 0 on the left. No tonsillar exudate.  Nose mildly congested. Oropharynx clear and moist, without uvular swelling or deviation, no trismus or drooling, no tonsillar swelling or erythema, no exudates.    Eyes: Conjunctivae and EOM are normal. Right eye exhibits no discharge. Left eye exhibits no discharge.  Neck: Normal range of motion. Neck supple.  Cardiovascular: Normal rate, regular rhythm, normal heart sounds and intact distal pulses. Exam reveals no gallop and no friction rub.  No murmur heard. Pulmonary/Chest: Effort normal and breath sounds normal. No respiratory distress. She has no decreased breath sounds. She has no wheezes. She has no rhonchi. She has no rales.  CTAB in all lung fields, no w/r/r, no hypoxia or increased WOB, speaking in full sentences, SpO2 99% on RA   Abdominal: Soft. Normal appearance and bowel sounds are normal. She exhibits no distension. There is no tenderness. There is no rigidity, no rebound, no guarding, no CVA tenderness, no tenderness at McBurney's point and negative Murphy's sign.  Musculoskeletal: Normal range of motion.  Neurological: She is alert and  oriented to person, place, and time. She has normal strength. No sensory deficit.  Skin: Skin is warm, dry and intact. No rash noted.  Psychiatric: She has a normal mood and affect.  Nursing note and vitals reviewed.    ED Treatments / Results  Labs (all labs ordered are listed, but only abnormal results are displayed) Labs Reviewed - No data to display  EKG None  Radiology Dg Chest 2 View  Result Date: 03/05/2018 CLINICAL DATA:  Cough. EXAM: CHEST - 2 VIEW COMPARISON:  06/25/2015 FINDINGS: The heart size and mediastinal contours are within normal limits. There is no evidence of pulmonary edema, consolidation, pneumothorax, nodule or pleural fluid. The visualized skeletal structures  are unremarkable. IMPRESSION: No active cardiopulmonary disease. Electronically Signed   By: Aletta Edouard M.D.   On: 03/05/2018 13:03    Procedures Procedures (including critical care time)  Medications Ordered in ED Medications - No data to display   Initial Impression / Assessment and Plan / ED Course  I have reviewed the triage vital signs and the nursing notes.  Pertinent labs & imaging results that were available during my care of the patient were reviewed by me and considered in my medical decision making (see chart for details).     52 y.o. female here with URI symptoms x1 wk. On exam, clear lungs, mild nasal congestion, throat clear, afebrile and nontoxic appearing. Will get CXR to eval for bronchitis/PNA/etc; doubt need for duonebs or other meds/labs. Will reassess shortly.   1:56 PM CXR unremarkable. Likely viral URI. Pt is agreeable to symptomatic treatment with close follow up with PCP as needed but spoke at length about emergent changing or worsening of symptoms that should prompt return to ER. Smoking cessation strongly encouraged. Also discussed DASH diet for her HTN, and avoidance of OTC things that would raise her BP; discussed f/up with PCP for her HTN as well. I explained the  diagnosis and have given explicit precautions to return to the ER including for any other new or worsening symptoms. The patient understands and accepts the medical plan as it's been dictated and I have answered their questions. Discharge instructions concerning home care and prescriptions have been given. The patient is STABLE and is discharged to home in good condition.      Final Clinical Impressions(s) / ED Diagnoses   Final diagnoses:  Viral URI with cough  Tobacco user  Essential hypertension    ED Discharge Orders    [address], [place], [place] 03/05/18 1359    Virgel Manifold, MD 03/12/18 [phone]

## 2018-03-05 NOTE — ED Triage Notes (Signed)
C/o flu like sx x 1 week-NAD-steady gait 

## 2018-03-05 NOTE — Discharge Instructions (Addendum)
Continue to stay well-hydrated. Gargle warm salt water and spit it out and use chloraseptic spray as needed for sore throat. Continue to alternate between Tylenol and Ibuprofen for pain or fever. Use REGULAR Mucinex (NOT mucinex D or any other form) for cough suppression/expectoration of mucus. Use netipot and flonase to help with nasal congestion. May consider over-the-counter Benadryl or other antihistamine to decrease secretions and for help with your symptoms. Avoid things like sudafed, caffeine, or other products that would make your blood pressure go up. You can use coricidin HBP for additional relief of your symptoms. STOP SMOKING! Your blood pressure was elevated today, take all of your usual home medications as directed, and eat a low salt/low sodium diet. Follow up with your primary care doctor in 5-7 days for recheck of ongoing symptoms and ongoing management of your high blood pressure. Return to emergency department for emergent changing or worsening of symptoms.

## 2018-03-07 DIAGNOSIS — F17210 Nicotine dependence, cigarettes, uncomplicated: Secondary | ICD-10-CM | POA: Diagnosis not present

## 2018-03-07 DIAGNOSIS — I10 Essential (primary) hypertension: Secondary | ICD-10-CM | POA: Diagnosis not present

## 2018-03-07 DIAGNOSIS — Z888 Allergy status to other drugs, medicaments and biological substances status: Secondary | ICD-10-CM | POA: Diagnosis not present

## 2018-03-07 DIAGNOSIS — T464X6A Underdosing of angiotensin-converting-enzyme inhibitors, initial encounter: Secondary | ICD-10-CM | POA: Diagnosis not present

## 2018-03-07 DIAGNOSIS — J209 Acute bronchitis, unspecified: Secondary | ICD-10-CM | POA: Diagnosis not present

## 2018-10-24 ENCOUNTER — Ambulatory Visit (HOSPITAL_COMMUNITY)
Admission: EM | Admit: 2018-10-24 | Discharge: 2018-10-24 | Disposition: A | Discharge status: Home / Self Care | Payer: BLUE CROSS/BLUE SHIELD | Attending: Internal Medicine | Admitting: Internal Medicine

## 2018-10-24 ENCOUNTER — Encounter (HOSPITAL_COMMUNITY): Payer: Self-pay | Admitting: Emergency Medicine

## 2018-10-24 ENCOUNTER — Ambulatory Visit (INDEPENDENT_AMBULATORY_CARE_PROVIDER_SITE_OTHER): Payer: BLUE CROSS/BLUE SHIELD

## 2018-10-24 DIAGNOSIS — R05 Cough: Secondary | ICD-10-CM | POA: Diagnosis not present

## 2018-10-24 DIAGNOSIS — M940 Chondrocostal junction syndrome [Tietze]: Secondary | ICD-10-CM | POA: Insufficient documentation

## 2018-10-24 DIAGNOSIS — J40 Bronchitis, not specified as acute or chronic: Secondary | ICD-10-CM | POA: Insufficient documentation

## 2018-10-24 MED ORDER — AZITHROMYCIN 250 MG PO TABS: ORAL_TABLET | ORAL | 0 refills | Status: DC

## 2018-10-24 MED ORDER — DICLOFENAC SODIUM 75 MG PO TBEC: 75.0000 mg | DELAYED_RELEASE_TABLET | Freq: Two times a day (BID) | ORAL | 0 refills | Status: DC

## 2018-10-24 NOTE — ED Provider Notes (Signed)
[facility]    CSN: [id number] Arrival date & time: 10/24/18  1045     History   Chief Complaint Chief Complaint  Patient presents with  . Cough  . Rib Pain    HPI LENNAN MALONE is a 53 y.o. female.   1- ONSET   Of R rib pain radiating to R LATERAL breast area x 3 weeks, pain provoked with movements and cough.  She has to hold her chest when she coughs. Has been feeling SOB lately. She denies injuring herself. 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   2- Onset of cough x 1 week and since she started to quitt smoking. The cough is not bad, slightly productive.     Past Medical History:  Diagnosis Date  . Anemia    iron deficiency  . Bacterial infection   . Chlamydia   . Fibroids   . Hypertension   . Migraine   . Scalp mass 02/25/2012  . Yeast infection     Patient Active Problem List   Diagnosis Date Noted  . Essential hypertension 07/13/2015  . Urinary frequency 07/13/2015  . Ovarian cyst 07/13/2015  . Chlamydia   . Fibroids   . Scalp mass 02/25/2012    Past Surgical History:  Procedure Laterality Date  . cervix cerclage  1987  . TOTAL ABDOMINAL HYSTERECTOMY    . TUBAL LIGATION    . WISDOM TOOTH EXTRACTION      OB History    Gravida  4   Para  3   Term      Preterm      AB  1   Living  3     SAB  1   TAB      Ectopic      Multiple      Live Births              Home Medications    Prior to Admission medications   Medication Sig Start Date End Date Taking? Authorizing Provider  azithromycin (ZITHROMAX Z-PAK) 250 MG tablet 2 today, then one qd x 4 days. 10/24/18   Rodriguez-Southworth, Sunday Spillers, PA-C  diclofenac (VOLTAREN) 75 MG EC tablet Take 1 tablet (75 mg total) by mouth 2 (two) times daily. 10/24/18   Rodriguez-Southworth, Sunday Spillers, PA-C  lisinopril-hydrochlorothiazide (PRINZIDE,ZESTORETIC) 20-25 MG tablet Take 1 tablet by mouth daily.  07/13/15   Golden Circle, FNP   Family History Family History  Problem Relation Age of Onset  . Heart disease Father   . Hypertension Father   . Stroke Father   . Hypertension Mother   . Multiple sclerosis Mother   . Cancer Maternal Grandmother   . Diabetes Maternal Grandfather   . Healthy Paternal Grandmother   . Hypertension Brother   . Asthma Sister   . Emphysema Sister   . Lung cancer Sister   . Sickle cell trait Daughter     Social History Social History   Tobacco Use  . Smoking status: Current Every Day Smoker    Types: Cigarettes  . Smokeless tobacco: Never Used  Substance Use Topics  . Alcohol use: Yes    Comment: occ  . Drug use: No   Allergies   Tylox [oxycodone-acetaminophen]  Review of Systems Review of Systems  Constitutional: Negative for chills, diaphoresis, fever and unexpected weight change.  HENT: Negative.   Eyes: Negative for discharge.  Respiratory: Positive for cough and shortness of breath. Negative for chest tightness.        Has chest wall soreness  Cardiovascular: Negative for chest pain, palpitations and leg swelling.  Gastrointestinal: Negative for abdominal distention, abdominal pain, nausea and vomiting.       Sometimes gets sharp intermittent pain in her R groin  Genitourinary: Negative for dyspareunia, frequency and urgency.  Musculoskeletal: Positive for myalgias. Negative for back pain, neck pain and neck stiffness.  Skin: Negative for rash and wound.  Neurological: Negative for numbness.   Physical Exam Triage Vital Signs ED Triage Vitals  Enc Vitals Group     BP 10/24/18 1136 (!) 142/75     Pulse Rate 10/24/18 1136 72     Resp 10/24/18 1136 18     Temp 10/24/18 1136 97.7 F (36.5 C)     Temp src --      SpO2 10/24/18 1136 100 %     Weight --      Height --      Head Circumference --      Peak Flow --      Pain Score 10/24/18 1137 6     Pain Loc --      Pain Edu? --      Excl. in [place]? --    No data  found.  Updated Vital Signs BP (!) 142/75   Pulse 72   Temp 97.7 F (36.5 C)   Resp 18   SpO2 100%   Visual Acuity Right Eye Distance:   Left Eye Distance:   Bilateral Distance:    Right Eye Near:   Left Eye Near:    Bilateral Near:     Physical Exam Vitals signs and nursing note reviewed.  Constitutional:      General: She is not in acute distress.    Appearance: Normal appearance. She is not toxic-appearing.  HENT:     Head: Normocephalic.     Right Ear: Tympanic membrane, ear canal and external ear normal.     Left Ear: Tympanic membrane, ear canal and external ear normal.     Nose: Nose normal.     Mouth/Throat:     Mouth: Mucous membranes are moist.     Pharynx: Oropharynx is clear.  Eyes:     General: No scleral icterus.       Right eye: No discharge.        Left eye: No discharge.     Conjunctiva/sclera: Conjunctivae normal.  Neck:     Musculoskeletal: Neck supple. No neck rigidity.  Cardiovascular:     Rate and Rhythm: Normal rate and regular rhythm.     Heart sounds: No murmur.  Pulmonary:     Effort: Pulmonary effort is normal.     Breath sounds: Normal breath sounds.     Comments: Has tender lateral ribs around the braw area, no rashes or bruising noted here. Chest:     Chest wall: Tenderness present.  Musculoskeletal: Normal range of motion.  Lymphadenopathy:     Cervical: No cervical adenopathy.  Skin:    General: Skin is warm and dry.     Coloration: Skin is not jaundiced.     Findings: No bruising, erythema or rash.  Neurological:     Mental Status: She is alert and oriented to person, place, and time.     Gait: Gait normal.  Psychiatric:        Mood and Affect: Mood normal.  Behavior: Behavior normal.        Thought Content: Thought content normal.        Judgment: Judgment normal.    UC Treatments / Results  Labs (all labs ordered are listed, but only abnormal results are displayed) Labs Reviewed - No data to  display  EKG None  Radiology Dg Chest 2 View  Result Date: 10/24/2018 CLINICAL DATA:  Initial evaluation for acute cough and shortness of breath. EXAM: CHEST - 2 VIEW COMPARISON:  Prior radiograph from 03/05/2018. FINDINGS: Cardiac and mediastinal silhouettes are stable in size and contour, and remain within normal limits. Lungs normally inflated. Mild scattered peribronchial thickening, suggesting possible bronchiolitis given provided history. No consolidative opacity to suggest pneumonia. No pulmonary edema or pleural effusion. No pneumothorax. No acute osseous finding. IMPRESSION: Mild scattered peribronchial thickening, suggesting bronchiolitis given the provided history of cough. No consolidative opacity to suggest pneumonia. Electronically Signed   By: Jeannine Boga M.D.   On: 10/24/2018 13:51    Procedures Procedures  Medications Ordered in UC Medications - No data to display  Initial Impression / Assessment and Plan / UC Course  I have reviewed the triage vital signs and the nursing notes. Pertinent  imaging results that were available during my care of the patient were reviewed by me and considered in my medical decision making (see chart for details). She has bronchitis and costochondritis more likely from the cough she has been having since xmas.  She was placen on Voltaren tablets and Zpack as noted.  Needs to Fu with PCP Final Clinical Impressions(s) / UC Diagnoses   Final diagnoses:  Bronchitis  Costochondritis     Discharge Instructions     Please follow up in 2 weeks with a new family practitioner.     ED Prescriptions    Medication Sig Dispense Auth. Provider   azithromycin (ZITHROMAX Z-PAK) 250 MG tablet 2 today, then one qd x 4 days. 6 each Rodriguez-Southworth, Sunday Spillers, PA-C   diclofenac (VOLTAREN) 75 MG EC tablet Take 1 tablet (75 mg total) by mouth 2 (two) times daily. 14 tablet Rodriguez-Southworth, Sunday Spillers, PA-C     Controlled Substance  Prescriptions [place] Controlled Substance Registry consulted?    Shelby Mattocks, PA-C 10/24/18 1707

## 2018-10-24 NOTE — Discharge Instructions (Signed)
Please follow up in 2 weeks with a new family practitioner.

## 2018-10-24 NOTE — ED Triage Notes (Signed)
Pt states she has right rib pain that starts in her back and travels up her R breast. Pt states its tender, painful to move. Pt also c/o cough, states shes trying to quit smoking

## 2018-11-20 ENCOUNTER — Encounter: Payer: Self-pay | Admitting: Family Medicine

## 2018-11-21 ENCOUNTER — Ambulatory Visit: Payer: BLUE CROSS/BLUE SHIELD | Admitting: Family Medicine

## 2019-08-04 DIAGNOSIS — Z23 Encounter for immunization: Secondary | ICD-10-CM | POA: Diagnosis not present

## 2019-08-04 DIAGNOSIS — I10 Essential (primary) hypertension: Secondary | ICD-10-CM | POA: Diagnosis not present

## 2019-08-04 DIAGNOSIS — Z7189 Other specified counseling: Secondary | ICD-10-CM | POA: Diagnosis not present

## 2019-08-05 DIAGNOSIS — Z23 Encounter for immunization: Secondary | ICD-10-CM | POA: Diagnosis not present

## 2019-08-05 DIAGNOSIS — D485 Neoplasm of uncertain behavior of skin: Secondary | ICD-10-CM | POA: Diagnosis not present

## 2019-08-06 ENCOUNTER — Other Ambulatory Visit: Payer: Self-pay | Admitting: Physician Assistant

## 2019-08-06 DIAGNOSIS — Z1231 Encounter for screening mammogram for malignant neoplasm of breast: Secondary | ICD-10-CM

## 2019-08-11 ENCOUNTER — Other Ambulatory Visit: Payer: Self-pay

## 2019-08-11 ENCOUNTER — Encounter (HOSPITAL_BASED_OUTPATIENT_CLINIC_OR_DEPARTMENT_OTHER): Payer: Self-pay | Admitting: *Deleted

## 2019-08-13 ENCOUNTER — Other Ambulatory Visit (HOSPITAL_COMMUNITY): Admission: RE | Admit: 2019-08-13 | Payer: BC Managed Care – PPO | Source: Ambulatory Visit

## 2019-08-13 ENCOUNTER — Encounter (HOSPITAL_BASED_OUTPATIENT_CLINIC_OR_DEPARTMENT_OTHER)
Admission: RE | Admit: 2019-08-13 | Discharge: 2019-08-13 | Disposition: A | Discharge status: Home / Self Care | Payer: BC Managed Care – PPO | Source: Ambulatory Visit | Attending: Specialist | Admitting: Specialist

## 2019-08-13 ENCOUNTER — Other Ambulatory Visit: Payer: Self-pay

## 2019-08-13 DIAGNOSIS — D170 Benign lipomatous neoplasm of skin and subcutaneous tissue of head, face and neck: Secondary | ICD-10-CM | POA: Diagnosis not present

## 2019-08-13 DIAGNOSIS — F17210 Nicotine dependence, cigarettes, uncomplicated: Secondary | ICD-10-CM | POA: Diagnosis not present

## 2019-08-13 DIAGNOSIS — I10 Essential (primary) hypertension: Secondary | ICD-10-CM | POA: Diagnosis not present

## 2019-08-13 DIAGNOSIS — Z79899 Other long term (current) drug therapy: Secondary | ICD-10-CM | POA: Diagnosis not present

## 2019-08-13 LAB — BASIC METABOLIC PANEL
Anion gap: 11 (ref 5–15)
BUN: 18 mg/dL (ref 6–20)
CO2: 21 mmol/L — ABNORMAL LOW (ref 22–32)
Calcium: 8.5 mg/dL — ABNORMAL LOW (ref 8.9–10.3)
Chloride: 108 mmol/L (ref 98–111)
Creatinine, Ser: 1.37 mg/dL — ABNORMAL HIGH (ref 0.44–1.00)
GFR calc Af Amer: 50 mL/min — ABNORMAL LOW (ref >60)
GFR calc non Af Amer: 43 mL/min — ABNORMAL LOW (ref >60)
Glucose, Bld: 102 mg/dL — ABNORMAL HIGH (ref 70–99)
Potassium: 4.5 mmol/L (ref 3.5–5.1)
Sodium: 140 mmol/L (ref 135–145)

## 2019-08-15 ENCOUNTER — Other Ambulatory Visit (HOSPITAL_COMMUNITY)
Admission: RE | Admit: 2019-08-15 | Discharge: 2019-08-15 | Disposition: A | Discharge status: Home / Self Care | Payer: BC Managed Care – PPO | Source: Ambulatory Visit | Attending: Specialist | Admitting: Specialist

## 2019-08-15 DIAGNOSIS — Z20828 Contact with and (suspected) exposure to other viral communicable diseases: Secondary | ICD-10-CM | POA: Diagnosis not present

## 2019-08-15 DIAGNOSIS — Z01812 Encounter for preprocedural laboratory examination: Secondary | ICD-10-CM | POA: Insufficient documentation

## 2019-08-15 LAB — SARS CORONAVIRUS 2 (TAT 6-24 HRS): SARS Coronavirus 2: NEGATIVE

## 2019-08-17 ENCOUNTER — Encounter (HOSPITAL_BASED_OUTPATIENT_CLINIC_OR_DEPARTMENT_OTHER): Payer: Self-pay | Admitting: Emergency Medicine

## 2019-08-17 ENCOUNTER — Ambulatory Visit (HOSPITAL_BASED_OUTPATIENT_CLINIC_OR_DEPARTMENT_OTHER): Payer: BC Managed Care – PPO | Admitting: Anesthesiology

## 2019-08-17 ENCOUNTER — Encounter (HOSPITAL_BASED_OUTPATIENT_CLINIC_OR_DEPARTMENT_OTHER)
Admission: RE | Disposition: A | Discharge status: Home / Self Care | Payer: Self-pay | Source: Home / Self Care | Attending: Specialist

## 2019-08-17 ENCOUNTER — Ambulatory Visit (HOSPITAL_BASED_OUTPATIENT_CLINIC_OR_DEPARTMENT_OTHER)
Admission: RE | Admit: 2019-08-17 | Discharge: 2019-08-17 | Disposition: A | Discharge status: Home / Self Care | Payer: BC Managed Care – PPO | Attending: Specialist | Admitting: Specialist

## 2019-08-17 ENCOUNTER — Other Ambulatory Visit: Payer: Self-pay

## 2019-08-17 DIAGNOSIS — F17210 Nicotine dependence, cigarettes, uncomplicated: Secondary | ICD-10-CM | POA: Diagnosis not present

## 2019-08-17 DIAGNOSIS — Z79899 Other long term (current) drug therapy: Secondary | ICD-10-CM | POA: Diagnosis not present

## 2019-08-17 DIAGNOSIS — R221 Localized swelling, mass and lump, neck: Secondary | ICD-10-CM | POA: Diagnosis not present

## 2019-08-17 DIAGNOSIS — R220 Localized swelling, mass and lump, head: Secondary | ICD-10-CM | POA: Diagnosis not present

## 2019-08-17 DIAGNOSIS — I10 Essential (primary) hypertension: Secondary | ICD-10-CM | POA: Insufficient documentation

## 2019-08-17 DIAGNOSIS — D485 Neoplasm of uncertain behavior of skin: Secondary | ICD-10-CM | POA: Diagnosis not present

## 2019-08-17 DIAGNOSIS — D170 Benign lipomatous neoplasm of skin and subcutaneous tissue of head, face and neck: Secondary | ICD-10-CM | POA: Insufficient documentation

## 2019-08-17 DIAGNOSIS — A749 Chlamydial infection, unspecified: Secondary | ICD-10-CM | POA: Diagnosis not present

## 2019-08-17 HISTORY — DX: Localized swelling, mass and lump, neck: R22.1

## 2019-08-17 HISTORY — PX: LIPOSUCTION: SHX10

## 2019-08-17 SURGERY — LIPOSUCTION
Anesthesia: General

## 2019-08-17 MED ORDER — DEXAMETHASONE SODIUM PHOSPHATE 10 MG/ML IJ SOLN
INTRAMUSCULAR | Status: DC | PRN
  Administered 2019-08-17: 6 mg via INTRAVENOUS

## 2019-08-17 MED ORDER — ROCURONIUM BROMIDE 10 MG/ML (PF) SYRINGE
PREFILLED_SYRINGE | INTRAVENOUS | Status: AC
  Filled 2019-08-17: qty 10

## 2019-08-17 MED ORDER — DEXAMETHASONE SODIUM PHOSPHATE 10 MG/ML IJ SOLN
INTRAMUSCULAR | Status: AC
  Filled 2019-08-17: qty 1

## 2019-08-17 MED ORDER — EPINEPHRINE PF 1 MG/ML IJ SOLN
INTRAMUSCULAR | Status: AC
  Filled 2019-08-17: qty 1

## 2019-08-17 MED ORDER — LIDOCAINE HCL (CARDIAC) PF 100 MG/5ML IV SOSY
PREFILLED_SYRINGE | INTRAVENOUS | Status: DC | PRN
  Administered 2019-08-17: 40 mg via INTRAVENOUS

## 2019-08-17 MED ORDER — LIDOCAINE 2% (20 MG/ML) 5 ML SYRINGE
INTRAMUSCULAR | Status: AC
  Filled 2019-08-17: qty 5

## 2019-08-17 MED ORDER — ONDANSETRON HCL 4 MG/2ML IJ SOLN
INTRAMUSCULAR | Status: AC
  Filled 2019-08-17: qty 2

## 2019-08-17 MED ORDER — CHLORHEXIDINE GLUCONATE CLOTH 2 % EX PADS: 6.0000 | MEDICATED_PAD | Freq: Once | CUTANEOUS | Status: DC

## 2019-08-17 MED ORDER — FENTANYL CITRATE (PF) 100 MCG/2ML IJ SOLN
INTRAMUSCULAR | Status: AC
  Filled 2019-08-17: qty 4

## 2019-08-17 MED ORDER — LIDOCAINE-EPINEPHRINE 1 %-1:100000 IJ SOLN
INTRAMUSCULAR | Status: AC
  Filled 2019-08-17: qty 1

## 2019-08-17 MED ORDER — LIDOCAINE-EPINEPHRINE 0.5 %-1:200000 IJ SOLN
INTRAMUSCULAR | Status: DC | PRN
  Administered 2019-08-17: 40 mL

## 2019-08-17 MED ORDER — MIDAZOLAM HCL 2 MG/2ML IJ SOLN
INTRAMUSCULAR | Status: AC
  Filled 2019-08-17: qty 2

## 2019-08-17 MED ORDER — MIDAZOLAM HCL 2 MG/2ML IJ SOLN
1.0000 mg | INTRAMUSCULAR | Status: DC | PRN
  Administered 2019-08-17: 07:00:00 2 mg via INTRAVENOUS

## 2019-08-17 MED ORDER — FENTANYL CITRATE (PF) 100 MCG/2ML IJ SOLN: 25.0000 ug | INTRAMUSCULAR | Status: DC | PRN

## 2019-08-17 MED ORDER — ROCURONIUM BROMIDE 100 MG/10ML IV SOLN
INTRAVENOUS | Status: DC | PRN
  Administered 2019-08-17: 50 mg via INTRAVENOUS

## 2019-08-17 MED ORDER — SUGAMMADEX SODIUM 500 MG/5ML IV SOLN
INTRAVENOUS | Status: DC | PRN
  Administered 2019-08-17: 200 mg via INTRAVENOUS

## 2019-08-17 MED ORDER — CEFAZOLIN SODIUM-DEXTROSE 2-4 GM/100ML-% IV SOLN
2.0000 g | INTRAVENOUS | Status: AC
  Administered 2019-08-17: 07:00:00 2 g via INTRAVENOUS

## 2019-08-17 MED ORDER — LIDOCAINE-EPINEPHRINE 0.5 %-1:200000 IJ SOLN
INTRAMUSCULAR | Status: AC
  Filled 2019-08-17: qty 1

## 2019-08-17 MED ORDER — FENTANYL CITRATE (PF) 100 MCG/2ML IJ SOLN
50.0000 ug | INTRAMUSCULAR | Status: DC | PRN
  Administered 2019-08-17 (×2): 100 ug via INTRAVENOUS

## 2019-08-17 MED ORDER — PROPOFOL 10 MG/ML IV BOLUS
INTRAVENOUS | Status: DC | PRN
  Administered 2019-08-17: 200 mg via INTRAVENOUS

## 2019-08-17 MED ORDER — LIDOCAINE HCL 2 % IJ SOLN
INTRAMUSCULAR | Status: AC
  Filled 2019-08-17: qty 100

## 2019-08-17 MED ORDER — SODIUM BICARBONATE 4.2 % IV SOLN
INTRAVENOUS | Status: AC
  Filled 2019-08-17: qty 10

## 2019-08-17 MED ORDER — LACTATED RINGERS IV SOLN
INTRAVENOUS | Status: DC | PRN
  Administered 2019-08-17: 07:00:00 via INTRAVENOUS

## 2019-08-17 MED ORDER — CEFAZOLIN SODIUM-DEXTROSE 2-4 GM/100ML-% IV SOLN
INTRAVENOUS | Status: AC
  Filled 2019-08-17: qty 100

## 2019-08-17 MED ORDER — ONDANSETRON HCL 4 MG/2ML IJ SOLN
INTRAMUSCULAR | Status: DC | PRN
  Administered 2019-08-17: 4 mg via INTRAVENOUS

## 2019-08-17 MED ORDER — ONDANSETRON HCL 4 MG/2ML IJ SOLN: 4.0000 mg | Freq: Once | INTRAMUSCULAR | Status: DC | PRN

## 2019-08-17 MED ORDER — PROPOFOL 10 MG/ML IV BOLUS
INTRAVENOUS | Status: AC
  Filled 2019-08-17: qty 40

## 2019-08-17 SURGICAL SUPPLY — 85 items
BAG DECANTER FOR FLEXI CONT (MISCELLANEOUS) IMPLANT
BENZOIN TINCTURE PRP APPL 2/3 (GAUZE/BANDAGES/DRESSINGS) IMPLANT
BLADE HEX COATED 2.75 (ELECTRODE) IMPLANT
BLADE KNIFE PERSONA 10 (BLADE) ×3 IMPLANT
BLADE KNIFE PERSONA 15 (BLADE) ×3 IMPLANT
BNDG COHESIVE 4X5 TAN STRL (GAUZE/BANDAGES/DRESSINGS) IMPLANT
BNDG ESMARK 4X9 LF (GAUZE/BANDAGES/DRESSINGS) IMPLANT
BNDG GAUZE ELAST 4 BULKY (GAUZE/BANDAGES/DRESSINGS) ×3 IMPLANT
CANISTER SUCT 1200ML W/VALVE (MISCELLANEOUS) IMPLANT
CLEANER CAUTERY TIP 5X5 PAD (MISCELLANEOUS) IMPLANT
CLOSURE SKIN 1/8X3 (GAUZE/BANDAGES/DRESSINGS)
CLOSURE WOUND 1/2 X4 (GAUZE/BANDAGES/DRESSINGS)
COTTONBALL LRG STERILE PKG (GAUZE/BANDAGES/DRESSINGS) IMPLANT
COVER BACK TABLE REUSABLE LG (DRAPES) ×3 IMPLANT
COVER MAYO STAND REUSABLE (DRAPES) ×3 IMPLANT
COVER WAND RF STERILE (DRAPES) IMPLANT
DECANTER SPIKE VIAL GLASS SM (MISCELLANEOUS) IMPLANT
DRAPE HALF SHEET 70X43 (DRAPES) ×3 IMPLANT
DRAPE LAPAROSCOPIC ABDOMINAL (DRAPES) ×3 IMPLANT
DRAPE LAPAROTOMY 100X72 PEDS (DRAPES) IMPLANT
DRAPE U-SHAPE 76X120 STRL (DRAPES) IMPLANT
DRSG PAD ABDOMINAL 8X10 ST (GAUZE/BANDAGES/DRESSINGS) ×3 IMPLANT
ELECT NEEDLE BLADE 2-5/6 (NEEDLE) IMPLANT
ELECT REM PT RETURN 9FT ADLT (ELECTROSURGICAL) ×3
ELECTRODE REM PT RTRN 9FT ADLT (ELECTROSURGICAL) ×1 IMPLANT
GAUZE SPONGE 4X4 12PLY STRL (GAUZE/BANDAGES/DRESSINGS) ×3 IMPLANT
GAUZE SPONGE 4X4 12PLY STRL LF (GAUZE/BANDAGES/DRESSINGS) ×3 IMPLANT
GAUZE XEROFORM 1X8 LF (GAUZE/BANDAGES/DRESSINGS) ×3 IMPLANT
GAUZE XEROFORM 5X9 LF (GAUZE/BANDAGES/DRESSINGS) IMPLANT
GLOVE BIO SURGEON STRL SZ 6.5 (GLOVE) ×2 IMPLANT
GLOVE BIO SURGEONS STRL SZ 6.5 (GLOVE) ×1
GLOVE BIOGEL M STRL SZ7.5 (GLOVE) ×3 IMPLANT
GLOVE BIOGEL PI IND STRL 8 (GLOVE) ×1 IMPLANT
GLOVE BIOGEL PI INDICATOR 8 (GLOVE) ×2
GLOVE ECLIPSE 7.0 STRL STRAW (GLOVE) ×3 IMPLANT
GOWN STRL REUS W/ TWL LRG LVL3 (GOWN DISPOSABLE) IMPLANT
GOWN STRL REUS W/ TWL XL LVL3 (GOWN DISPOSABLE) ×2 IMPLANT
GOWN STRL REUS W/TWL LRG LVL3 (GOWN DISPOSABLE)
GOWN STRL REUS W/TWL XL LVL3 (GOWN DISPOSABLE) ×4
IV LACTATED RINGERS 1000ML (IV SOLUTION) IMPLANT
IV NS 500ML (IV SOLUTION)
IV NS 500ML BAXH (IV SOLUTION) IMPLANT
LINER CANISTER 1000CC FLEX (MISCELLANEOUS) ×3 IMPLANT
MARKER SKIN DUAL TIP RULER LAB (MISCELLANEOUS) ×3 IMPLANT
NDL SAFETY ECLIPSE 18X1.5 (NEEDLE) IMPLANT
NEEDLE HYPO 18GX1.5 SHARP (NEEDLE)
NEEDLE HYPO 25X1 1.5 SAFETY (NEEDLE) IMPLANT
NEEDLE SPNL 18GX3.5 QUINCKE PK (NEEDLE) ×3 IMPLANT
PACK BASIN DAY SURGERY FS (CUSTOM PROCEDURE TRAY) ×3 IMPLANT
PAD CLEANER CAUTERY TIP 5X5 (MISCELLANEOUS)
PEN SKIN MARKING BROAD TIP (MISCELLANEOUS) IMPLANT
PENCIL BUTTON HOLSTER BLD 10FT (ELECTRODE) IMPLANT
PENCIL SMOKE EVACUATOR (MISCELLANEOUS) ×3 IMPLANT
SHEETING SILICONE GEL EPI DERM (MISCELLANEOUS) IMPLANT
SLEEVE SCD COMPRESS KNEE MED (MISCELLANEOUS) ×3 IMPLANT
SPONGE LAP 18X18 RF (DISPOSABLE) ×3 IMPLANT
STAPLER VISISTAT 35W (STAPLE) IMPLANT
STOCKINETTE 4X48 STRL (DRAPES) IMPLANT
STOCKINETTE IMPERVIOUS LG (DRAPES) IMPLANT
STRIP CLOSURE SKIN 1/2X4 (GAUZE/BANDAGES/DRESSINGS) IMPLANT
STRIP CLOSURE SKIN 1/8X3 (GAUZE/BANDAGES/DRESSINGS) IMPLANT
STRIP SUTURE WOUND CLOSURE 1/2 (SUTURE) IMPLANT
SUCTION FRAZIER HANDLE 10FR (MISCELLANEOUS)
SUCTION TUBE FRAZIER 10FR DISP (MISCELLANEOUS) IMPLANT
SUT ETHILON 3 0 PS 1 (SUTURE) IMPLANT
SUT MNCRL AB 3-0 PS2 18 (SUTURE) ×3 IMPLANT
SUT MON AB 2-0 CT1 36 (SUTURE) IMPLANT
SUT MON AB 5-0 PS2 18 (SUTURE) IMPLANT
SUT PROLENE 4 0 P 3 18 (SUTURE) IMPLANT
SUT PROLENE 4 0 PS 2 18 (SUTURE) IMPLANT
SUT VIC AB 0 CT1 27 (SUTURE)
SUT VIC AB 0 CT1 27XBRD ANBCTR (SUTURE) IMPLANT
SYR 20ML LL LF (SYRINGE) ×3 IMPLANT
SYR 50ML LL SCALE MARK (SYRINGE) IMPLANT
SYR CONTROL 10ML LL (SYRINGE) IMPLANT
TAPE HYPAFIX 6 X30' (GAUZE/BANDAGES/DRESSINGS)
TAPE HYPAFIX 6X30 (GAUZE/BANDAGES/DRESSINGS) IMPLANT
TOWEL GREEN STERILE FF (TOWEL DISPOSABLE) ×3 IMPLANT
TRAY DSU PREP LF (CUSTOM PROCEDURE TRAY) ×3 IMPLANT
TUBE CONNECTING 20'X1/4 (TUBING) ×1
TUBE CONNECTING 20X1/4 (TUBING) ×2 IMPLANT
TUBING INFILTRATION IT-10001 (TUBING) IMPLANT
TUBING SET GRADUATE ASPIR 12FT (MISCELLANEOUS) ×3 IMPLANT
UNDERPAD 30X36 HEAVY ABSORB (UNDERPADS AND DIAPERS) IMPLANT
YANKAUER SUCT BULB TIP NO VENT (SUCTIONS) ×3 IMPLANT

## 2019-08-17 NOTE — Anesthesia Postprocedure Evaluation (Signed)
Anesthesia Post Note  Patient: [employer]  Procedure(s) Performed: LIPOSUCTION POSTERIOR NECK (N/A )     Patient location during evaluation: PACU Anesthesia Type: General Level of consciousness: awake and alert Pain management: pain level controlled Vital Signs Assessment: post-procedure vital signs reviewed and stable Respiratory status: spontaneous breathing, nonlabored ventilation, respiratory function stable and patient connected to nasal cannula oxygen Cardiovascular status: blood pressure returned to baseline and stable Postop Assessment: no apparent nausea or vomiting Anesthetic complications: no    Last Vitals:  Vitals:   08/17/19 0845 08/17/19 0922  BP: (!) 141/88 133/76  Pulse: 91 82  Resp: 20 16  Temp:  36.8 C  SpO2: 96% 97%    Last Pain:  Vitals:   08/17/19 0922  TempSrc:   PainSc: 0-No pain                 Tiburcio Linder COKER

## 2019-08-17 NOTE — Anesthesia Preprocedure Evaluation (Addendum)
Anesthesia Evaluation  Patient identified by MRN, date of birth, ID band Patient awake    Reviewed: Allergy & Precautions, NPO status , Patient's Chart, lab work & pertinent test results  Airway Mallampati: II  TM Distance: >3 FB Neck ROM: Full    Dental  (+) Teeth Intact, Dental Advisory Given   Pulmonary Current Smoker,    breath sounds clear to auscultation       Cardiovascular hypertension,  Rhythm:Regular Rate:Normal     Neuro/Psych    GI/Hepatic   Endo/Other    Renal/GU      Musculoskeletal   Abdominal   Peds  Hematology   Anesthesia Other Findings   Reproductive/Obstetrics                            Anesthesia Physical Anesthesia Plan  ASA: II  Anesthesia Plan: General   Post-op Pain Management:    Induction: Intravenous  PONV Risk Score and Plan: Ondansetron and Dexamethasone  Airway Management Planned: Oral ETT  Additional Equipment:   Intra-op Plan:   Post-operative Plan: Extubation in OR  Informed Consent: I have reviewed the patients History and Physical, chart, labs and discussed the procedure including the risks, benefits and alternatives for the proposed anesthesia with the patient or authorized representative who has indicated his/her understanding and acceptance.     Dental advisory given  Plan Discussed with: CRNA and Anesthesiologist  Anesthesia Plan Comments:         Anesthesia Quick Evaluation

## 2019-08-17 NOTE — Discharge Instructions (Signed)
Activity (include date of return to work if known) °As tolerated: NO showers °NO driving °No heavy activities ° °Diet:regular No restrictions: ° °Wound Care: Keep dressing clean & dry ° °Do not change dressings °For Abdominoplasties wear abdominal binder °Special Instructions: °Do not raise arms up °Continue to empty, recharge, & record drainage from J-P drains &/or °Hemovacs 2-3 times a day, as needed. °Call Doctor if any unusual problems occur such as pain, excessive °Bleeding, unrelieved Nausea/vomiting, Fever &/or chills °When lying down, keep head elevated on 2-3 pillows or back-rest °For Addominoplasties the Jack-knife position °Follow-up appointment: Please call the office. ° °The patient received discharge instruction from:___________________________________________ ° ° °Patient signature ________________________________________ / Date___________ ° ° ° °Signature of individual providing instructions/ Date________________             ° ° ° ° ° ° °Post Anesthesia Home Care Instructions ° °Activity: °Get plenty of rest for the remainder of the day. A responsible individual must stay with you for 24 hours following the procedure.  °For the next 24 hours, DO NOT: °-Drive a car °-Operate machinery °-Drink alcoholic beverages °-Take any medication unless instructed by your physician °-Make any legal decisions or sign important papers. ° °Meals: °Start with liquid foods such as gelatin or soup. Progress to regular foods as tolerated. Avoid greasy, spicy, heavy foods. If nausea and/or vomiting occur, drink only clear liquids until the nausea and/or vomiting subsides. Call your physician if vomiting continues. ° °Special Instructions/Symptoms: °Your throat may feel dry or sore from the anesthesia or the breathing tube placed in your throat during surgery. If this causes discomfort, gargle with warm salt water. The discomfort should disappear within 24 hours. ° °If you had a scopolamine patch placed behind your ear for  the management of post- operative nausea and/or vomiting: ° °1. The medication in the patch is effective for 72 hours, after which it should be removed.  Wrap patch in a tissue and discard in the trash. Wash hands thoroughly with soap and water. °2. You may remove the patch earlier than 72 hours if you experience unpleasant side effects which may include dry mouth, dizziness or visual disturbances. °3. Avoid touching the patch. Wash your hands with soap and water after contact with the patch. °  ° °

## 2019-08-17 NOTE — H&P (Signed)
[name] is an 53 y.o. female.   Chief Complaint: Increased mass effect occipital scalp   HPI: Increased growth and discomfort  Past Medical History:  Diagnosis Date  . Essential hypertension   . Fibroids   . IDA (iron deficiency anemia)   . Neck mass    posterior  . Scalp mass 02/25/2012    Past Surgical History:  Procedure Laterality Date  . cervix cerclage  1987  . CESAREAN SECTION    . TOTAL ABDOMINAL HYSTERECTOMY  04/13/2005  . TUBAL LIGATION    . WISDOM TOOTH EXTRACTION      Family History  Problem Relation Age of Onset  . Heart disease Father   . Hypertension Father   . Stroke Father   . Hypertension Mother   . Multiple sclerosis Mother   . Cancer Maternal Grandmother   . Diabetes Maternal Grandfather   . Healthy Paternal Grandmother   . Hypertension Brother   . Asthma Sister   . Emphysema Sister   . Lung cancer Sister   . Sickle cell trait Daughter    Social History:  reports that she has been smoking cigarettes. She has been smoking about 0.25 packs per day. She has never used smokeless tobacco. She reports current alcohol use. She reports that she does not use drugs.  Allergies:  Allergies  Allergen Reactions  . Tylox [Oxycodone-Acetaminophen] Itching  . Tyloxapol Hives    Medications Prior to Admission  Medication Sig Dispense Refill  . cholecalciferol (VITAMIN D3) 25 MCG (1000 UT) tablet Take 1,000 Units by mouth daily.    Marland Kitchen lisinopril-hydrochlorothiazide (PRINZIDE,ZESTORETIC) 20-25 MG tablet Take 1 tablet by mouth daily. 30 tablet 0  . Multiple Vitamin (MULTIVITAMIN WITH MINERALS) TABS tablet Take 1 tablet by mouth daily.      Results for orders placed or performed during the hospital encounter of 08/15/19 (from the past 48 hour(s))  SARS CORONAVIRUS 2 (TAT 6-24 HRS) Nasopharyngeal Nasopharyngeal Swab     Status: None   Collection Time: 08/15/19  5:57 PM   Specimen: Nasopharyngeal Swab  Result Value Ref Range   SARS Coronavirus 2  NEGATIVE NEGATIVE    Comment: (NOTE) SARS-CoV-2 target nucleic acids are NOT DETECTED. The SARS-CoV-2 RNA is generally detectable in upper and lower respiratory specimens during the acute phase of infection. Negative results do not preclude SARS-CoV-2 infection, do not rule out co-infections with other pathogens, and should not be used as the sole basis for treatment or other patient management decisions. Negative results must be combined with clinical observations, patient history, and epidemiological information. The expected result is Negative. Fact Sheet for Patients: SugarRoll.be Fact Sheet for Healthcare Providers: https://www.woods-mathews.com/ This test is not yet approved or cleared by the [place] FDA and  has been authorized for detection and/or diagnosis of SARS-CoV-2 by FDA under an Emergency Use Authorization (EUA). This EUA will remain  in effect (meaning this test can be used) for the duration of the COVID-19 declaration under Section 56 4(b)(1) of the Act, 21 U.S.C. section 360bbb-3(b)(1), unless the authorization is terminated or revoked sooner. Performed at [facility], [place] [address] [place], [place] [address]    No results found.  Review of Systems  Constitutional: Negative.   HENT: Negative.   Eyes: Negative.   Respiratory: Negative.   Cardiovascular: Negative.   Gastrointestinal: Negative.   Genitourinary: Negative.   Musculoskeletal: Negative.   Skin: Negative.   Neurological: Negative.   Endo/Heme/Allergies: Negative.   Psychiatric/Behavioral: Negative.  Blood pressure (!) 176/90, pulse 66, temperature 98.6 F (37 C), temperature source Oral, resp. rate 16, height 5\' 2"  (1.575 m), weight 84.3 kg, SpO2 99 %. Physical Exam  Constitutional: She is oriented to person, place, and time. She appears well-developed and well-nourished.  HENT:  Head: Normocephalic.  Nose: Nose normal.  Eyes:  Pupils are equal, round, and reactive to light. Conjunctivae and EOM are normal.  Neck: Normal range of motion. Neck supple. No JVD present. No tracheal deviation present. No thyromegaly present.  Cardiovascular: Normal rate and regular rhythm.  No murmur heard. Respiratory: Effort normal and breath sounds normal.  GI: Soft. Bowel sounds are normal. She exhibits no distension. There is no abdominal tenderness. There is no rebound.  Musculoskeletal: Normal range of motion.  Lymphadenopathy:    She has no cervical adenopathy.  Neurological: She is alert and oriented to person, place, and time. She has normal reflexes.  Skin: Skin is warm.  Psychiatric: She has a normal mood and affect.     Assessment/Plan Large mass occipital cscalp for excision and plastic closure  Pascal Stiggers L, MD 08/17/2019, 7:10 AM

## 2019-08-17 NOTE — Brief Op Note (Signed)
08/17/2019  8:11 AM  PATIENT:  [name]  53 y.o. female  PRE-OPERATIVE DIAGNOSIS:  LARGE MASS OF NECK  POST-OPERATIVE DIAGNOSIS:  * No post-op diagnosis entered *  PROCEDURE:  Procedure(s): LIPOSUCTION POSTERIOR NECK (N/A)  SURGEON:  Surgeon(s) and Role:    * Cristine Polio, MD - Primary  PHYSICIAN ASSISTANT:   ASSISTANTS: none   ANESTHESIA:   general  EBL:  20 mL   BLOOD ADMINISTERED:none  DRAINS: none   LOCAL MEDICATIONS USED:  XYLOCAINE   SPECIMEN:  Excision  DISPOSITION OF SPECIMEN:  PATHOLOGY  COUNTS:  YES  TOURNIQUET:  * No tourniquets in log *  DICTATION: .Other Dictation: Dictation Number [id number]  PLAN OF CARE: Discharge to home after PACU  PATIENT DISPOSITION:  PACU - hemodynamically stable.   Delay start of Pharmacological VTE agent (>24hrs) due to surgical blood loss or risk of bleeding: yes

## 2019-08-17 NOTE — Transfer of Care (Signed)
Immediate Anesthesia Transfer of Care Note  Patient: [name]  Procedure(s) Performed: LIPOSUCTION POSTERIOR NECK (N/A )  Patient Location: PACU  Anesthesia Type:General  Level of Consciousness: awake, alert , oriented and patient cooperative  Airway & Oxygen Therapy: Patient Spontanous Breathing and Patient connected to face mask oxygen  Post-op Assessment: Report given to RN, Post -op Vital signs reviewed and stable and Patient moving all extremities X 4  Post vital signs: Reviewed and stable  Last Vitals:  Vitals Value Taken Time  BP    Temp    Pulse 90 08/17/19 0819  Resp    SpO2 99 % 08/17/19 0819  Vitals shown include unvalidated device data.  Last Pain:  Vitals:   08/17/19 0646  TempSrc: Oral  PainSc: 0-No pain         Complications: No apparent anesthesia complications

## 2019-08-17 NOTE — Anesthesia Procedure Notes (Signed)
Procedure Name: Intubation Date/Time: 08/17/2019 7:38 AM Performed by: Jonna Munro, CRNA Pre-anesthesia Checklist: Patient identified, Emergency Drugs available, Suction available, Patient being monitored and Timeout performed Patient Re-evaluated:Patient Re-evaluated prior to induction Oxygen Delivery Method: Circle system utilized Preoxygenation: Pre-oxygenation with 100% oxygen Induction Type: IV induction Ventilation: Mask ventilation without difficulty Laryngoscope Size: Mac and 3 Grade View: Grade I Tube type: Oral Tube size: 7.0 mm Number of attempts: 1 Airway Equipment and Method: Stylet Placement Confirmation: ETT inserted through vocal cords under direct vision,  positive ETCO2 and breath sounds checked- equal and bilateral Secured at: 20 cm Tube secured with: Tape Dental Injury: Teeth and Oropharynx as per pre-operative assessment

## 2019-08-17 NOTE — Anesthesia Postprocedure Evaluation (Signed)
Anesthesia Post Note  Patient: [employer]  Procedure(s) Performed: LIPOSUCTION POSTERIOR NECK (N/A )     Patient location during evaluation: PACU Anesthesia Type: General Level of consciousness: awake, oriented, patient cooperative and awake and alert Pain management: pain level controlled Vital Signs Assessment: post-procedure vital signs reviewed and stable Respiratory status: spontaneous breathing, respiratory function stable and nonlabored ventilation Cardiovascular status: stable Postop Assessment: no apparent nausea or vomiting Anesthetic complications: no    Last Vitals:  Vitals:   08/17/19 0646  BP: (!) 176/90  Pulse: 66  Resp: 16  Temp: 37 C  SpO2: 99%    Last Pain:  Vitals:   08/17/19 0646  TempSrc: Oral  PainSc: 0-No pain                 Jakeem Grape

## 2019-08-18 ENCOUNTER — Encounter (HOSPITAL_BASED_OUTPATIENT_CLINIC_OR_DEPARTMENT_OTHER): Payer: Self-pay | Admitting: Specialist

## 2019-08-18 LAB — SURGICAL PATHOLOGY

## 2019-08-18 NOTE — Op Note (Signed)
NAME: [name], [place] MEDICAL RECORD [id number] ACCOUNT [id number] DATE OF BIRTH:03/06/1966 FACILITY: [facility] LOCATION: [facility] PHYSICIAN:Eh Sesay L. Kamuela Magos, MD  OPERATIVE REPORT  DATE OF PROCEDURE:  08/17/2019  INDICATIONS:  A 53 year old lady with enlarging mass above the occipital scalp area, measures over 12 x 10 cm, increased growth, pain, discomfort.  PROCEDURE:  Excision of mass from the occipital scalp with use of  liposuction assistance.  ANESTHESIA:   Local with general, 1% with epinephrine ____1:100,000 concentration a total of 50 mL.    DESCRIPTION OF PROCEDURE: The patient was taken to the operating room and placed on the operating room table in the supine position, was intubated, then placed in prone position.  Prep was done to the occipital scalp , neck areas with Betadine solution,  walled off with sterile towels and drapes so as make a sterile field.  Xylocaine  0.5% with epinephrine was injected locally, ____1:200,000 concentration, a total of 50 mL into the mass. We let that set up for at least 10 minutes.  Incisions were made  with 15 blade at 3 o'clock, 6 o'clock and 9 o'clock positions and then liposuction was then done using a #4 and #3 cannula ____ with good success removing all of the mass from the area by rotating the areas.  After this, the incisions were closed x2 with  3-0 Monocryl.  The wound was left open for drainage, 4 x 4, ABDs, Hypafix and facial wrap.  She tolerated the procedures very well and was taken to recovery in good condition.  VN/NUANCE  D:08/17/2019 T:08/17/2019 JOB:[id number]

## 2019-09-29 ENCOUNTER — Ambulatory Visit: Payer: BLUE CROSS/BLUE SHIELD

## 2019-11-19 ENCOUNTER — Other Ambulatory Visit: Payer: Self-pay

## 2019-11-19 ENCOUNTER — Ambulatory Visit
Admission: RE | Admit: 2019-11-19 | Discharge: 2019-11-19 | Disposition: A | Discharge status: Home / Self Care | Payer: BC Managed Care – PPO | Source: Ambulatory Visit | Attending: Physician Assistant | Admitting: Physician Assistant

## 2019-11-19 DIAGNOSIS — Z1231 Encounter for screening mammogram for malignant neoplasm of breast: Secondary | ICD-10-CM | POA: Diagnosis not present

## 2019-11-23 ENCOUNTER — Other Ambulatory Visit: Payer: Self-pay

## 2019-11-24 ENCOUNTER — Encounter: Payer: Self-pay | Admitting: Obstetrics and Gynecology

## 2019-11-24 ENCOUNTER — Ambulatory Visit (INDEPENDENT_AMBULATORY_CARE_PROVIDER_SITE_OTHER): Payer: BC Managed Care – PPO | Admitting: Obstetrics and Gynecology

## 2019-11-24 ENCOUNTER — Other Ambulatory Visit: Payer: Self-pay | Admitting: Physician Assistant

## 2019-11-24 VITALS — BP 144/86 | Ht 61.5 in | Wt 185.0 lb

## 2019-11-24 DIAGNOSIS — Z13220 Encounter for screening for lipoid disorders: Secondary | ICD-10-CM | POA: Diagnosis not present

## 2019-11-24 DIAGNOSIS — N951 Menopausal and female climacteric states: Secondary | ICD-10-CM | POA: Diagnosis not present

## 2019-11-24 DIAGNOSIS — Z01419 Encounter for gynecological examination (general) (routine) without abnormal findings: Secondary | ICD-10-CM

## 2019-11-24 DIAGNOSIS — E559 Vitamin D deficiency, unspecified: Secondary | ICD-10-CM | POA: Diagnosis not present

## 2019-11-24 DIAGNOSIS — N6310 Unspecified lump in the right breast, unspecified quadrant: Secondary | ICD-10-CM

## 2019-11-24 NOTE — Progress Notes (Signed)
   [name] [birth year] [id number]  SUBJECTIVE:  54 y.o. [id number] female for annual routine gynecologic exam and Pap smear.  New to our office. She has no gynecologic concerns.  Minor hot flashes.   Current Outpatient Medications  Medication Sig Dispense Refill  . cholecalciferol (VITAMIN D3) 25 MCG (1000 UT) tablet Take 1,000 Units by mouth daily.    Marland Kitchen lisinopril-hydrochlorothiazide (PRINZIDE,ZESTORETIC) 20-25 MG tablet Take 1 tablet by mouth daily. 30 tablet 0   No current facility-administered medications for this visit.   Allergies: Tylox [oxycodone-acetaminophen] and Tyloxapol  No LMP recorded. Patient has had a hysterectomy.  Past medical history,surgical history, problem list, medications, allergies, family history and social history were all reviewed and documented as reviewed in the EPIC chart.  ROS:  Feeling well. No dyspnea or chest pain on exertion.  No abdominal pain, change in bowel habits, black or bloody stools.  No urinary tract symptoms. GYN ROS: no abnormal bleeding, pelvic pain or discharge, no breast pain or new or enlarging lumps on self exam. No neurological complaints.   OBJECTIVE:  BP (!) 154/90   Ht 5' 1.5" (1.562 m)   Wt 185 lb (83.9 kg)   BMI 34.39 kg/m   BP 144/86 on recheck The patient appears well, alert, oriented x 3, in no distress. ENT normal.  Neck supple. No cervical or supraclavicular adenopathy or thyromegaly.  Lungs are clear, good air entry, no wheezes, rhonchi or rales. S1 and S2 normal, no murmurs, regular rate and rhythm.  Abdomen soft without tenderness, guarding, mass or organomegaly.  Neurological is normal, no focal findings.  BREAST EXAM: breasts appear normal, no suspicious masses, no skin or nipple changes or axillary nodes  PELVIC EXAM: VULVA: normal appearing vulva with no masses, tenderness or lesions, VAGINA: normal appearing vagina with normal color and discharge, no lesions, CERVIX: surgically absent, UTERUS:  surgically absent, vaginal cuff well healed, ADNEXA: normal adnexa in size, nontender and no masses, RECTAL: normal rectal, no masses  Chaperone: Caryn Bee present during the examination  ASSESSMENT:  54 y.o. [id number] here for annual gynecologic exam  PLAN:   1. Perimenopause vs menopause.  Previous hysterectomy with retained ovaries for fibroids.   Mild vasomotor symptoms manageable at this time. 2. Pap smear 12/2015. Vaginal cuff Pap smear repeated today. No prior history of abnormal Pap smears.  3. Mammogram 11/19/2019. Will continue with annual mammography. Breast exam normal today. 4. Colonoscopy not yet performed.  She states she had this scheduled through her workplace health care provider but this got canceled with the pandemic, so she will plan to coordinate through them again soon. 5. Elevated blood pressure today.  She is on anithypertensive medication.  I recommended that she track her BP periodically throughout the wee and if values are systolic [id number] and diastolic [id number], she should let her primary care provider or [place] know so dose adjustments to her medication can be made. 6. States she is getting Shingrix soon. 7. Health maintenance.  She will proceed to lab today for CBC, CMP, lipid profile, and vitamin D.  Recommend smoking cessation, we can provide counseling resources if desired.  Return annually or sooner, prn.  Joseph Pierini MD  11/24/19

## 2019-11-24 NOTE — Addendum Note (Signed)
Addended by: Nelva Nay on: 11/24/2019 09:49 AM   Modules accepted: Orders

## 2019-11-24 NOTE — Patient Instructions (Signed)
We will let you know the results of your Pap smear and lab work when available Please keep an eye on your blood pressure and if running high (top number higher than 130, bottom number higher than 85) please let your primary care doctor or [place] know so your blood pressure medication dose can be adjusted Please schedule a colonoscopy at your earliest convenience  Please let us know if you need any assistance with cutting back or quitting smoking

## 2019-11-25 LAB — COMPREHENSIVE METABOLIC PANEL
AG Ratio: 1.1 (calc) (ref 1.0–2.5)
ALT: 19 U/L (ref 6–29)
AST: 18 U/L (ref 10–35)
Albumin: 3.7 g/dL (ref 3.6–5.1)
Alkaline phosphatase (APISO): 104 U/L (ref 37–153)
BUN/Creatinine Ratio: 10 (calc) (ref 6–22)
BUN: 14 mg/dL (ref 7–25)
CO2: 25 mmol/L (ref 20–32)
Calcium: 9.2 mg/dL (ref 8.6–10.4)
Chloride: 107 mmol/L (ref 98–110)
Creat: 1.35 mg/dL — ABNORMAL HIGH (ref 0.50–1.05)
Globulin: 3.4 g/dL (calc) (ref 1.9–3.7)
Glucose, Bld: 129 mg/dL — ABNORMAL HIGH (ref 65–99)
Potassium: 3.9 mmol/L (ref 3.5–5.3)
Sodium: 142 mmol/L (ref 135–146)
Total Bilirubin: 0.3 mg/dL (ref 0.2–1.2)
Total Protein: 7.1 g/dL (ref 6.1–8.1)

## 2019-11-25 LAB — LIPID PANEL
Cholesterol: 229 mg/dL — ABNORMAL HIGH (ref <200)
HDL: 71 mg/dL (ref >=50)
LDL Cholesterol (Calc): 141 mg/dL (calc) — ABNORMAL HIGH
Non-HDL Cholesterol (Calc): 158 mg/dL (calc) — ABNORMAL HIGH (ref <130)
Total CHOL/HDL Ratio: 3.2 (calc) (ref <5.0)
Triglycerides: 73 mg/dL (ref <150)

## 2019-11-25 LAB — PAP IG W/ RFLX HPV ASCU

## 2019-11-25 LAB — CBC
HCT: 36.2 % (ref 35.0–45.0)
Hemoglobin: 12.1 g/dL (ref 11.7–15.5)
MCH: 28.4 pg (ref 27.0–33.0)
MCHC: 33.4 g/dL (ref 32.0–36.0)
MCV: 85 fL (ref 80.0–100.0)
MPV: 10.7 fL (ref 7.5–12.5)
Platelets: 385 10*3/uL (ref 140–400)
RBC: 4.26 10*6/uL (ref 3.80–5.10)
RDW: 14 % (ref 11.0–15.0)
WBC: 10.5 10*3/uL (ref 3.8–10.8)

## 2019-11-25 LAB — HEMOGLOBIN A1C
Hgb A1c MFr Bld: 6.2 % of total Hgb — ABNORMAL HIGH (ref <5.7)
Mean Plasma Glucose: 131 (calc)
eAG (mmol/L): 7.3 (calc)

## 2019-11-25 LAB — VITAMIN D 25 HYDROXY (VIT D DEFICIENCY, FRACTURES): Vit D, 25-Hydroxy: 12 ng/mL — ABNORMAL LOW (ref 30–100)

## 2019-11-27 NOTE — Progress Notes (Signed)
Please let Ms. Menta know her Pap smear is normal.  Blood counts and electrolytes are normal.  Her creatinine level is still elevated as it was last time she had this checked, this indicates her kidneys might not be working quite as well, or she is dehydrated.  This can sometimes go along with hypertension.  If she was fasting for her labs, significant findings include her glucose/blood sugar level was elevated, her cholesterol level is high and bad cholesterol/LDL level are elevated.  Good news is her triglycerides and good cholesterol/HDL level are normal.  Also her vitamin D level is low, so we need to make sure that she is supplementing with at least 1000 IU/day, she can increase to 2000 IU/day if she is already taking a lower dose.  I would strongly recommend that she check in with a family doctor or internist for long-term monitoring of the cholesterol levels and kidney function.

## 2019-12-08 ENCOUNTER — Ambulatory Visit
Admission: RE | Admit: 2019-12-08 | Discharge: 2019-12-08 | Disposition: A | Discharge status: Home / Self Care | Payer: BC Managed Care – PPO | Source: Ambulatory Visit | Attending: Physician Assistant | Admitting: Physician Assistant

## 2019-12-08 ENCOUNTER — Other Ambulatory Visit: Payer: Self-pay

## 2019-12-08 DIAGNOSIS — N6489 Other specified disorders of breast: Secondary | ICD-10-CM | POA: Diagnosis not present

## 2019-12-08 DIAGNOSIS — N6310 Unspecified lump in the right breast, unspecified quadrant: Secondary | ICD-10-CM

## 2019-12-08 DIAGNOSIS — R928 Other abnormal and inconclusive findings on diagnostic imaging of breast: Secondary | ICD-10-CM | POA: Diagnosis not present

## 2019-12-11 ENCOUNTER — Other Ambulatory Visit: Payer: BC Managed Care – PPO

## 2020-06-02 DIAGNOSIS — Z20822 Contact with and (suspected) exposure to covid-19: Secondary | ICD-10-CM | POA: Diagnosis not present

## 2020-06-03 ENCOUNTER — Other Ambulatory Visit: Payer: BC Managed Care – PPO

## 2020-11-24 DIAGNOSIS — I129 Hypertensive chronic kidney disease with stage 1 through stage 4 chronic kidney disease, or unspecified chronic kidney disease: Secondary | ICD-10-CM | POA: Diagnosis not present

## 2020-11-24 DIAGNOSIS — R809 Proteinuria, unspecified: Secondary | ICD-10-CM | POA: Diagnosis not present

## 2020-11-24 DIAGNOSIS — R7303 Prediabetes: Secondary | ICD-10-CM | POA: Diagnosis not present

## 2020-11-24 DIAGNOSIS — N1832 Chronic kidney disease, stage 3b: Secondary | ICD-10-CM | POA: Diagnosis not present

## 2020-11-28 ENCOUNTER — Encounter: Payer: BC Managed Care – PPO | Admitting: Women's Health

## 2020-11-29 ENCOUNTER — Encounter: Payer: Self-pay | Admitting: Obstetrics and Gynecology

## 2020-11-29 ENCOUNTER — Other Ambulatory Visit: Payer: Self-pay

## 2020-11-29 ENCOUNTER — Ambulatory Visit (INDEPENDENT_AMBULATORY_CARE_PROVIDER_SITE_OTHER): Payer: BC Managed Care – PPO | Admitting: Obstetrics and Gynecology

## 2020-11-29 VITALS — BP 160/84 | HR 72 | Ht 61.0 in | Wt 172.0 lb

## 2020-11-29 DIAGNOSIS — N951 Menopausal and female climacteric states: Secondary | ICD-10-CM

## 2020-11-29 DIAGNOSIS — Z01419 Encounter for gynecological examination (general) (routine) without abnormal findings: Secondary | ICD-10-CM

## 2020-11-29 DIAGNOSIS — L299 Pruritus, unspecified: Secondary | ICD-10-CM

## 2020-11-29 NOTE — Progress Notes (Signed)
[name] [birth year] [id number]  SUBJECTIVE:  55 y.o. [id number] female for annual routine gynecologic exam and Pap smear.  Having some itching in the pubic hair and groin crease areas.  Has tried petroleum jelly and switching to [employer].  Not sexually active. She has no other gynecologic concerns.  Current Outpatient Medications  Medication Sig Dispense Refill  . amLODipine (NORVASC) 5 MG tablet Take 5 mg by mouth daily.    . cholecalciferol (VITAMIN D3) 25 MCG (1000 UT) tablet Take 1,000 Units by mouth daily.    Marland Kitchen lisinopril (ZESTRIL) 40 MG tablet TAKE 1 TABLET EVERY DAY BY ORAL ROUTE IN THE MORNING.     No current facility-administered medications for this visit.   Allergies: Tylox [oxycodone-acetaminophen] and Tyloxapol  No LMP recorded. Patient has had a hysterectomy.  Past medical history,surgical history, problem list, medications, allergies, family history and social history were all reviewed and documented as reviewed in the EPIC chart.  ROS:  Feeling well. No dyspnea or chest pain on exertion.  No abdominal pain, change in bowel habits, black or bloody stools.  No urinary tract symptoms. GYN ROS: no abnormal bleeding, pelvic pain or discharge, no breast pain or new or enlarging lumps on self exam. No neurological complaints.   OBJECTIVE:  BP (!) 152/84 (BP Location: Right Arm, Patient Position: Sitting, Cuff Size: Normal)   Pulse 72   Ht 5\' 1"  (1.549 m)   Wt 172 lb (78 kg)   BMI 32.50 kg/m   The patient appears well, alert, oriented, in no distress. Lungs are clear, good air entry, no wheezes, rhonchi or rales. S1 and S2 normal, no murmurs, regular rate and rhythm.  Abdomen soft without tenderness, guarding, mass or organomegaly.  Neurological is normal, no focal findings.  BREAST EXAM: breasts appear normal, no suspicious masses, no skin or nipple changes or axillary nodes  PELVIC EXAM: VULVA: normal appearing vulva with no masses, tenderness or lesions, no  rashes or exudate in the groin/mons area, VAGINA: normal appearing vagina with normal color and discharge, no lesions, CERVIX: surgically absent, UTERUS: surgically absent, vaginal cuff well healed, ADNEXA: normal adnexa in size, nontender and no masses, RECTAL: normal rectal, no masses  Chaperone: Terence Lux present during the examination  ASSESSMENT:  55 y.o. [id number] here for annual gynecologic exam  PLAN:   1. Perimenopause vs menopause.  Previous hysterectomy with retained ovaries for fibroids.   Mild vasomotor symptoms manageable at this time.  Discussed the itching and there are no visible rashes on exam no evidence of yeast.  Recommend using 1% OTC hydrocortisone to control the itch as needed.  If no improvement in symptoms and she will let us know. 2. Pap smear 12/2019 vaginal cuff Pap smear was normal. No prior history of abnormal Pap smears.  3. Mammogram 11/2019. Will continue with annual mammography, reminded to schedule this year. Breast exam normal today. 4. Colonoscopy not yet performed.  Colon cancer among the most common cancers diagnosed in women and early detection best affords ability to treat.  Recommend that she schedule a screening colonoscopy soon. 5. Elevated blood pressure today.  She is on anithypertensive medication and follows with her primary care provider.  Her blood pressure is elevated today.  Says she was just started on a new blood pressure pill regimen and she is taking her medication.  I recommended that she track her BP periodically throughout the week and if values are systolic >856 and diastolic >31, she should let her  primary care provider know. 6. Health maintenance.  She checks her routine blood labs elsewhere.  Recommend smoking cessation, which she says she is working on in addition to starting more exercise routine.  She has lost weight since last year and she is congratulated on this. Return annually or sooner, prn.  Joseph Pierini MD  11/29/20

## 2020-11-29 NOTE — Patient Instructions (Signed)
I recommend trying applying 1% hydrocortisone cream over-the-counter to the itchy areas in the groin and hairbearing pubic areas.  Put this on 2-3 times a day as needed and if no improvement in symptoms then please let us know.

## 2021-04-24 DIAGNOSIS — I129 Hypertensive chronic kidney disease with stage 1 through stage 4 chronic kidney disease, or unspecified chronic kidney disease: Secondary | ICD-10-CM | POA: Diagnosis not present

## 2021-04-24 DIAGNOSIS — R809 Proteinuria, unspecified: Secondary | ICD-10-CM | POA: Diagnosis not present

## 2021-04-24 DIAGNOSIS — N1831 Chronic kidney disease, stage 3a: Secondary | ICD-10-CM | POA: Diagnosis not present

## 2021-04-24 DIAGNOSIS — R7303 Prediabetes: Secondary | ICD-10-CM | POA: Diagnosis not present

## 2021-04-25 ENCOUNTER — Other Ambulatory Visit: Payer: Self-pay | Admitting: Nephrology

## 2021-04-25 DIAGNOSIS — R809 Proteinuria, unspecified: Secondary | ICD-10-CM

## 2021-04-25 DIAGNOSIS — N1831 Chronic kidney disease, stage 3a: Secondary | ICD-10-CM

## 2021-05-04 ENCOUNTER — Ambulatory Visit
Admission: RE | Admit: 2021-05-04 | Discharge: 2021-05-04 | Disposition: A | Discharge status: Home / Self Care | Payer: BC Managed Care – PPO | Source: Ambulatory Visit | Attending: Nephrology | Admitting: Nephrology

## 2021-05-04 DIAGNOSIS — R809 Proteinuria, unspecified: Secondary | ICD-10-CM | POA: Diagnosis not present

## 2021-05-04 DIAGNOSIS — N1831 Chronic kidney disease, stage 3a: Secondary | ICD-10-CM

## 2021-05-16 ENCOUNTER — Other Ambulatory Visit: Payer: Self-pay | Admitting: Nephrology

## 2021-05-16 DIAGNOSIS — N2889 Other specified disorders of kidney and ureter: Secondary | ICD-10-CM

## 2021-06-01 LAB — HM DIABETES EYE EXAM

## 2021-06-05 ENCOUNTER — Ambulatory Visit
Admission: RE | Admit: 2021-06-05 | Discharge: 2021-06-05 | Disposition: A | Discharge status: Home / Self Care | Payer: BC Managed Care – PPO | Source: Ambulatory Visit | Attending: Nephrology | Admitting: Nephrology

## 2021-06-05 ENCOUNTER — Other Ambulatory Visit: Payer: Self-pay

## 2021-06-05 DIAGNOSIS — N2889 Other specified disorders of kidney and ureter: Secondary | ICD-10-CM

## 2021-06-05 MED ORDER — IOPAMIDOL (ISOVUE-300) INJECTION 61%
100.0000 mL | Freq: Once | INTRAVENOUS | Status: AC | PRN
  Administered 2021-06-05: 100 mL via INTRAVENOUS

## 2021-06-09 DIAGNOSIS — R768 Other specified abnormal immunological findings in serum: Secondary | ICD-10-CM | POA: Diagnosis not present

## 2021-07-13 DIAGNOSIS — N1831 Chronic kidney disease, stage 3a: Secondary | ICD-10-CM | POA: Diagnosis not present

## 2021-07-13 DIAGNOSIS — R809 Proteinuria, unspecified: Secondary | ICD-10-CM | POA: Diagnosis not present

## 2021-07-13 DIAGNOSIS — R7303 Prediabetes: Secondary | ICD-10-CM | POA: Diagnosis not present

## 2021-07-13 DIAGNOSIS — I129 Hypertensive chronic kidney disease with stage 1 through stage 4 chronic kidney disease, or unspecified chronic kidney disease: Secondary | ICD-10-CM | POA: Diagnosis not present

## 2021-09-26 DIAGNOSIS — N1831 Chronic kidney disease, stage 3a: Secondary | ICD-10-CM | POA: Diagnosis not present

## 2021-09-26 DIAGNOSIS — R809 Proteinuria, unspecified: Secondary | ICD-10-CM | POA: Diagnosis not present

## 2021-09-26 DIAGNOSIS — E1122 Type 2 diabetes mellitus with diabetic chronic kidney disease: Secondary | ICD-10-CM | POA: Diagnosis not present

## 2021-09-26 DIAGNOSIS — I129 Hypertensive chronic kidney disease with stage 1 through stage 4 chronic kidney disease, or unspecified chronic kidney disease: Secondary | ICD-10-CM | POA: Diagnosis not present

## 2021-12-24 IMAGING — MG MM DIGITAL DIAGNOSTIC UNILAT*R* W/ TOMO W/ CAD
4 series · 4 of 12 positions shown · non-contrast
Comparison: Screening mammogram dated 11/19/2019.

CLINICAL DATA: Screening recall for possible right breast mass.

EXAM:
DIGITAL DIAGNOSTIC UNILATERAL RIGHT MAMMOGRAM WITH CAD AND TOMO
RIGHT BREAST ULTRASOUND

[R CC synth-2D]
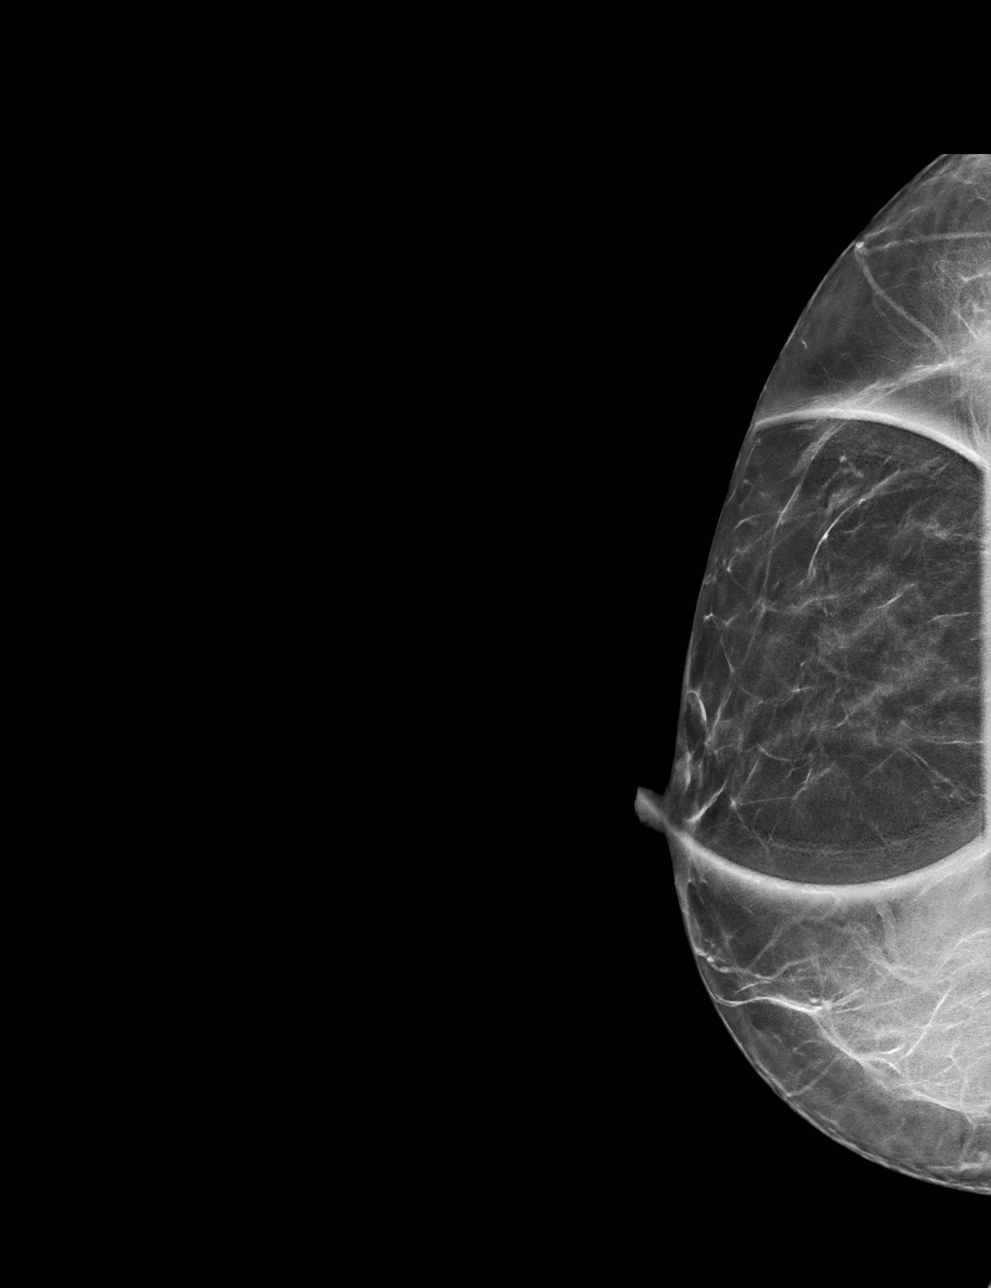

[R MLO synth-2D]
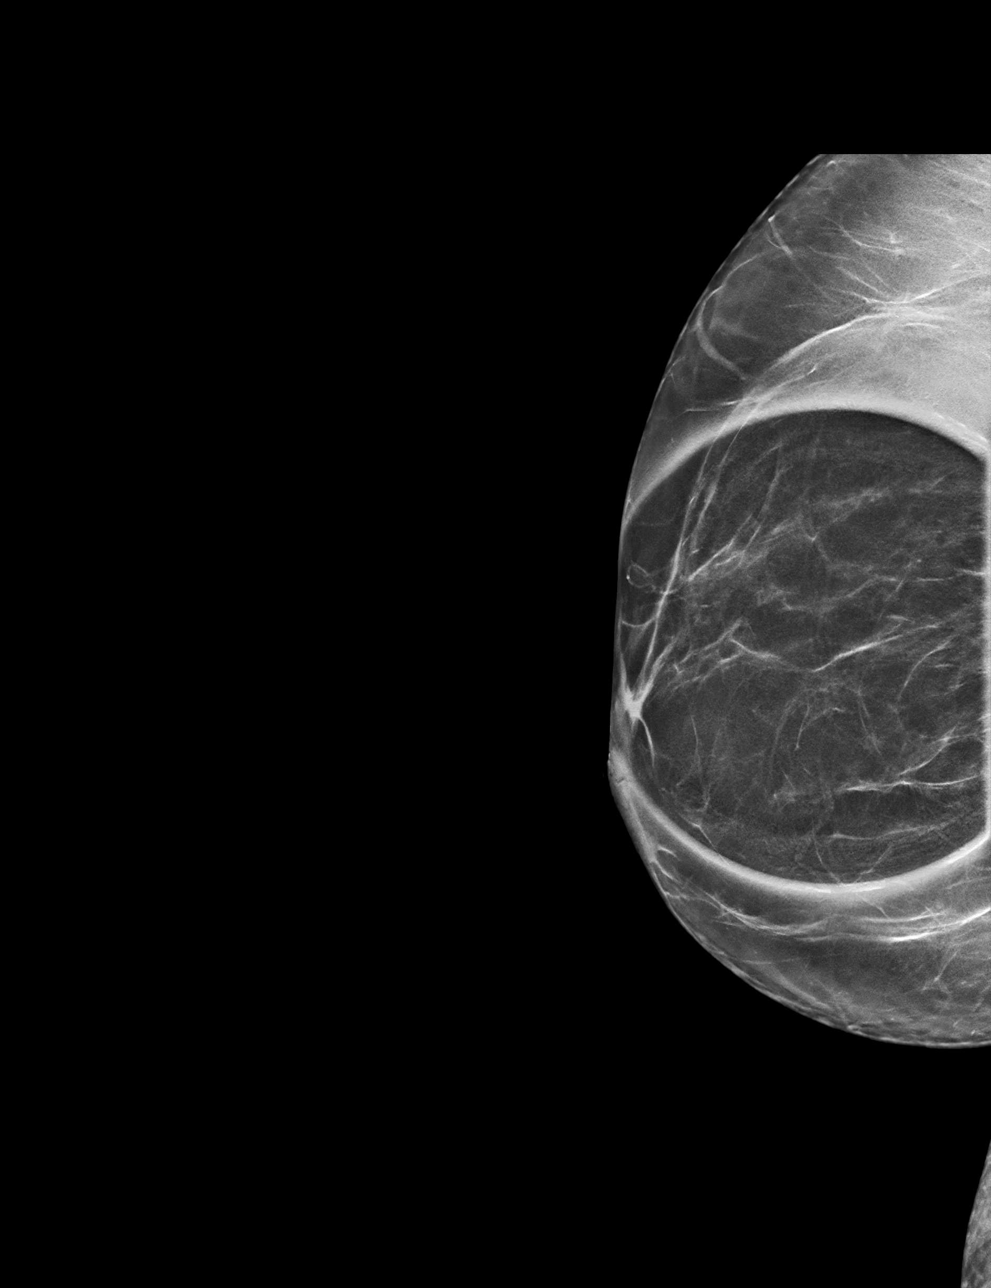

[R MLO tomo · tomo slice 30/59.0]
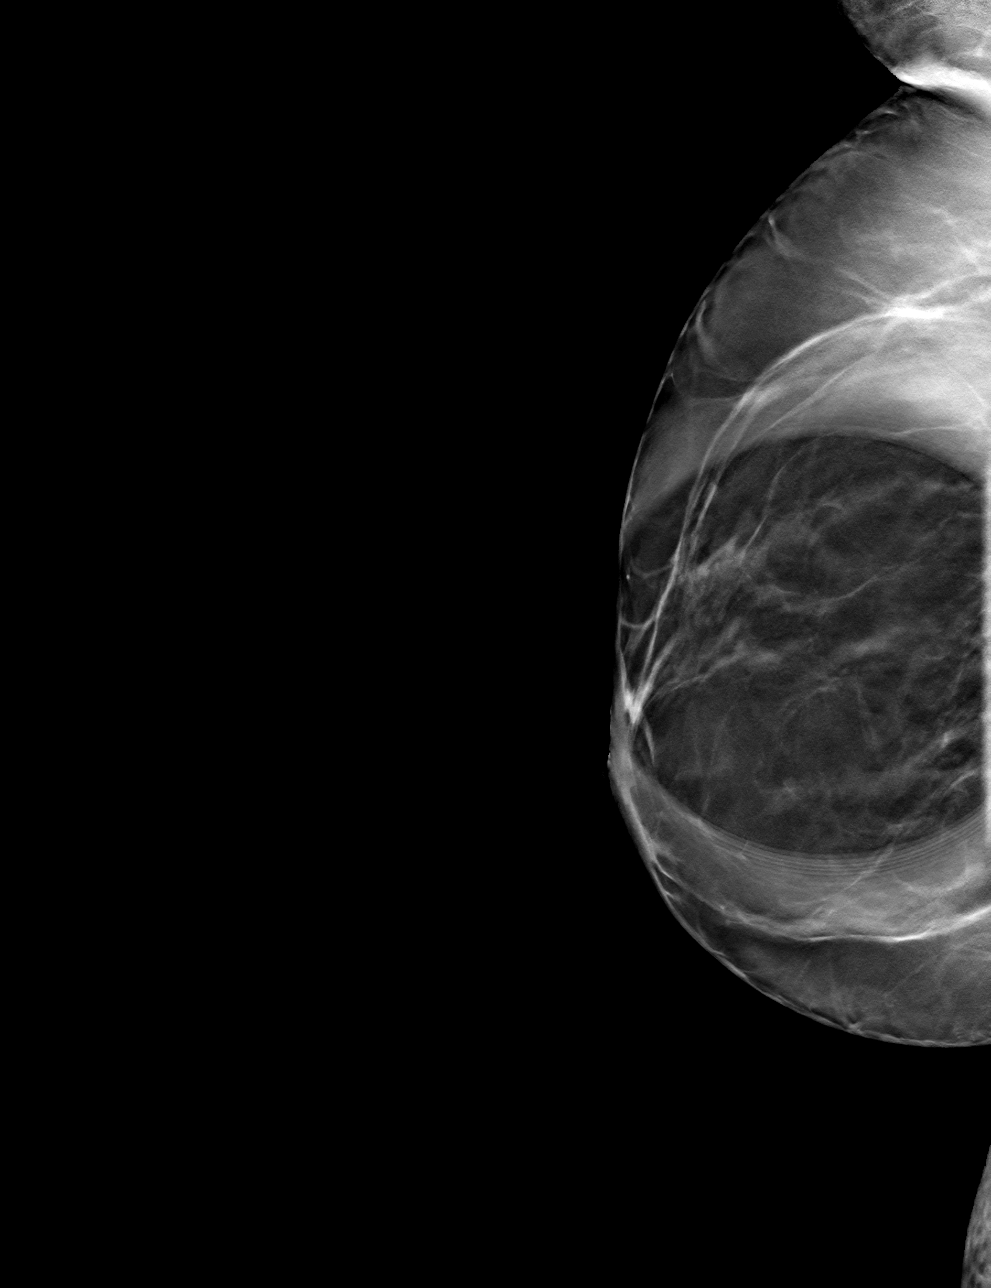

[R CC tomo · tomo slice 25/50.0]
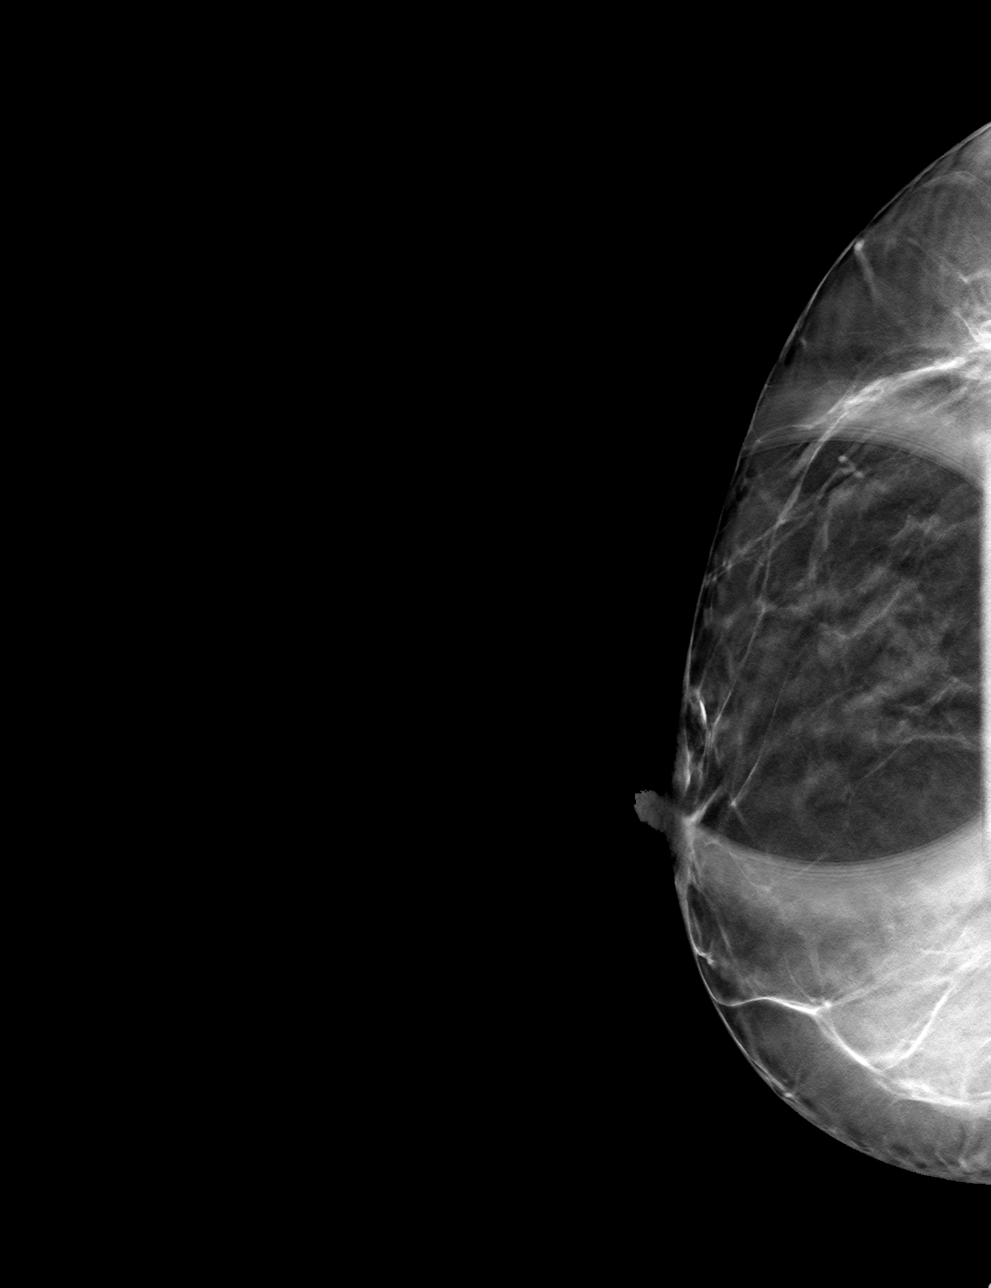

[4 of 12 positions shown; findings below may reference images not displayed]

ACR Breast Density Category b: There are scattered areas of
fibroglandular density.
FINDINGS: Spot compression tomograms were performed of the right breast. The
initially questioned possible right breast mass is felt to resolve
on the additional imaging with findings suggestive of an area of
overlapping fibroglandular tissue.

Mammographic images were processed with CAD.

Targeted ultrasound of the upper-outer right breast was performed.
No suspicious masses or abnormality seen, only heterogeneous
fibroglandular tissue identified. This fibroglandular tissue is felt
to correspond well the asymmetry seen in the right breast at
mammography.
IMPRESSION: No findings of malignancy in the right breast.

RECOMMENDATION:
Screening mammogram in one year.(Code:LE-D-F1B)

I have discussed the findings and recommendations with the patient.
If applicable, a reminder letter will be sent to the patient
regarding the next appointment.

BI-RADS CATEGORY  1: Negative.

## 2022-02-02 ENCOUNTER — Ambulatory Visit (INDEPENDENT_AMBULATORY_CARE_PROVIDER_SITE_OTHER): Payer: BC Managed Care – PPO | Admitting: Nurse Practitioner

## 2022-02-02 VITALS — BP 132/86 | HR 72 | Temp 98.0°F | Ht 60.63 in | Wt 185.0 lb

## 2022-02-02 DIAGNOSIS — N1831 Chronic kidney disease, stage 3a: Secondary | ICD-10-CM | POA: Insufficient documentation

## 2022-02-02 DIAGNOSIS — R21 Rash and other nonspecific skin eruption: Secondary | ICD-10-CM | POA: Diagnosis not present

## 2022-02-02 DIAGNOSIS — R635 Abnormal weight gain: Secondary | ICD-10-CM | POA: Insufficient documentation

## 2022-02-02 DIAGNOSIS — Z13220 Encounter for screening for lipoid disorders: Secondary | ICD-10-CM | POA: Diagnosis not present

## 2022-02-02 DIAGNOSIS — I10 Essential (primary) hypertension: Secondary | ICD-10-CM

## 2022-02-02 DIAGNOSIS — R7303 Prediabetes: Secondary | ICD-10-CM | POA: Diagnosis not present

## 2022-02-02 DIAGNOSIS — Z1211 Encounter for screening for malignant neoplasm of colon: Secondary | ICD-10-CM

## 2022-02-02 DIAGNOSIS — N1832 Chronic kidney disease, stage 3b: Secondary | ICD-10-CM

## 2022-02-02 DIAGNOSIS — Z1231 Encounter for screening mammogram for malignant neoplasm of breast: Secondary | ICD-10-CM

## 2022-02-02 HISTORY — DX: Encounter for screening mammogram for malignant neoplasm of breast: Z12.31

## 2022-02-02 LAB — TSH: TSH: 0.41 u[IU]/mL (ref 0.35–5.50)

## 2022-02-02 LAB — COMPREHENSIVE METABOLIC PANEL
ALT: 19 U/L (ref 0–35)
AST: 18 U/L (ref 0–37)
Albumin: 4.2 g/dL (ref 3.5–5.2)
Alkaline Phosphatase: 98 U/L (ref 39–117)
BUN: 14 mg/dL (ref 6–23)
CO2: 28 mEq/L (ref 19–32)
Calcium: 9.4 mg/dL (ref 8.4–10.5)
Chloride: 103 mEq/L (ref 96–112)
Creatinine, Ser: 1.37 mg/dL — ABNORMAL HIGH (ref 0.40–1.20)
GFR: 42.87 mL/min — ABNORMAL LOW (ref >60.00)
Glucose, Bld: 85 mg/dL (ref 70–99)
Potassium: 3.8 mEq/L (ref 3.5–5.1)
Sodium: 137 mEq/L (ref 135–145)
Total Bilirubin: 0.5 mg/dL (ref 0.2–1.2)
Total Protein: 8.3 g/dL (ref 6.0–8.3)

## 2022-02-02 LAB — CBC WITH DIFFERENTIAL/PLATELET
Basophils Absolute: 0.1 10*3/uL (ref 0.0–0.1)
Basophils Relative: 0.9 % (ref 0.0–3.0)
Eosinophils Absolute: 0.1 10*3/uL (ref 0.0–0.7)
Eosinophils Relative: 1.5 % (ref 0.0–5.0)
HCT: 35.3 % — ABNORMAL LOW (ref 36.0–46.0)
Hemoglobin: 11.3 g/dL — ABNORMAL LOW (ref 12.0–15.0)
Lymphocytes Relative: 30.8 % (ref 12.0–46.0)
Lymphs Abs: 2.7 10*3/uL (ref 0.7–4.0)
MCHC: 31.9 g/dL (ref 30.0–36.0)
MCV: 84 fl (ref 78.0–100.0)
Monocytes Absolute: 0.6 10*3/uL (ref 0.1–1.0)
Monocytes Relative: 7.5 % (ref 3.0–12.0)
Neutro Abs: 5.1 10*3/uL (ref 1.4–7.7)
Neutrophils Relative %: 59.3 % (ref 43.0–77.0)
Platelets: 326 10*3/uL (ref 150.0–400.0)
RBC: 4.2 Mil/uL (ref 3.87–5.11)
RDW: 14.9 % (ref 11.5–15.5)
WBC: 8.6 10*3/uL (ref 4.0–10.5)

## 2022-02-02 LAB — HEMOGLOBIN A1C: Hgb A1c MFr Bld: 6.7 % — ABNORMAL HIGH (ref 4.6–6.5)

## 2022-02-02 LAB — T4, FREE: Free T4: 0.88 ng/dL (ref 0.60–1.60)

## 2022-02-02 LAB — MICROALBUMIN / CREATININE URINE RATIO
Creatinine,U: 130.2 mg/dL
Microalb Creat Ratio: 15.2 mg/g (ref 0.0–30.0)
Microalb, Ur: 19.8 mg/dL — ABNORMAL HIGH (ref 0.0–1.9)

## 2022-02-02 LAB — LIPID PANEL
Cholesterol: 174 mg/dL (ref 0–200)
HDL: 57.9 mg/dL (ref >39.00)
LDL Cholesterol: 106 mg/dL — ABNORMAL HIGH (ref 0–99)
NonHDL: 115.85
Total CHOL/HDL Ratio: 3
Triglycerides: 51 mg/dL (ref 0.0–149.0)
VLDL: 10.2 mg/dL (ref 0.0–40.0)

## 2022-02-02 LAB — T3, FREE: T3, Free: 3.1 pg/mL (ref 2.3–4.2)

## 2022-02-02 MED ORDER — TRIAMCINOLONE ACETONIDE 0.1 % EX CREA: 1.0000 "application " | TOPICAL_CREAM | Freq: Two times a day (BID) | CUTANEOUS | 0 refills | Status: DC

## 2022-02-02 NOTE — Assessment & Plan Note (Addendum)
Rash not very obvious on exam, however I did note may be some very small papules.  Will treat empirically with triamcinolone cream to be applied twice a day up to 14 days to see if this improves rash.  Further recommendations may be made based upon response to triamcinolone. ?

## 2022-02-02 NOTE — Assessment & Plan Note (Signed)
A1c ordered today, further recommendations may be made based upon these results. 

## 2022-02-02 NOTE — Assessment & Plan Note (Signed)
Etiology unclear, probably multifactorial.  Will initially start evaluation by getting blood work today, further recommendations may be made based upon these results. ?

## 2022-02-02 NOTE — Progress Notes (Signed)
? ? ? ?Subjective:  ?Patient ID: [name], female    DOB: 08/29/1966  Age: 56 y.o. MRN: [id number] ? ?CC:  ?Chief Complaint  ?Patient presents with  ? New patient  ?  ? ? ?HPI  ?This patient arrives today for the above. ? 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 ? ?She is establishing today because she would like additional comprehensive care of her primary care needs.  She reports she has hypertension (takes lisinopril 73m/day, amlodipine 529mday, and hydrochlorothiazide 12.78m18may for this), prediabetes (she has been prescribed metformin however is not taking this consistently), and chronic kidney disease.  She is being followed by nephrology.  She was evaluated by rheumatology in the past because she had a positive ANA and her nephrologist wanted her to be evaluated for possible lupus, she reports that she was told that she does NOT have lupus.  She has access to previous lab results on her phone, she shows me that her last EGFR was 43 (collected about 6 months ago) and urine protein creatinine ratio of 201.  She also tells me that she will get intermittent coughing which she thinks may be being caused by her lisinopril. ? ?She has never undergone colon cancer screening, and she is overdue for screening mammogram. ? ?Her main concern is that she feels she has been gaining weight, she is concerned it may be fluid retention.  She reports a 20# weight gain over the last 3 months. She denies any orthopnea, but does report intermittent leg swelling.  She does feel more fatigued than normal however she does not report significant shortness of breath with activities of daily living.  She is a former smoker and quit in October 2022.  She also reports a rash that is on bilateral arms, interrupted about 1 week ago.  It seems to come and go.  She reports that it is itchy at times.  She reports history of eczema.  She denies any known exposure to any new  allergens. ? ?Past Medical History:  ?Diagnosis Date  ? Essential hypertension   ? Fibroids   ? IDA (iron deficiency anemia)   ? Neck mass   ? posterior  ? Scalp mass 02/25/2012  ? ? ? ? ?Family History  ?Problem Relation Age of Onset  ? Heart disease Father   ? Hypertension Father   ? Stroke Father   ? Hypertension Mother   ? Multiple sclerosis Mother   ? Cancer Maternal Grandmother   ?     ?type  ? Diabetes Maternal Grandfather   ? Healthy Paternal Grandmother   ? Hypertension Brother   ? Asthma Sister   ? Emphysema Sister   ? Lung cancer Sister   ? Sickle cell trait Daughter   ? ? ?Social History  ? ?Social History Narrative  ? Fun: Family time, movies and relaxing, outdoors  ? Feels safe at home and denies abuse  ? ?Social History  ? ?Tobacco Use  ? Smoking status: Every Day  ?  Types: E-cigarettes, Cigarettes  ? Smokeless tobacco: Current  ?Substance Use Topics  ? Alcohol use: Yes  ?  Comment: occ  ? ? ? ?Current Meds  ?Medication Sig  ? amLODipine (NORVASC) 5 MG tablet Take 5 mg by mouth daily.  ? cholecalciferol (VITAMIN D3) 25 MCG (1000 UT) tablet Take 1,000 Units by mouth daily.  ? hydrochlorothiazide (MICROZIDE) 12.5 MG capsule Take 12.5 mg by mouth daily.  ?  metFORMIN (GLUCOPHAGE-XR) 500 MG 24 hr tablet Take 500 mg by mouth daily with breakfast.  ? triamcinolone cream (KENALOG) 0.1 % Apply 1 application. topically 2 (two) times daily.  ? [DISCONTINUED] lisinopril (ZESTRIL) 40 MG tablet TAKE 1 TABLET EVERY DAY BY ORAL ROUTE IN THE MORNING.  ? ? ?ROS:  ?Review of Systems  ?Constitutional:  Positive for malaise/fatigue. Negative for fever and weight loss.  ?Respiratory:  Positive for shortness of breath. Negative for cough.   ?Cardiovascular:  Positive for leg swelling. Negative for chest pain, orthopnea and PND.  ?Gastrointestinal:  Negative for abdominal pain and blood in stool.  ?Neurological:  Negative for dizziness, loss of consciousness and headaches.  ?Psychiatric/Behavioral:  Negative for depression  and suicidal ideas.   ? ? ?Objective:  ? ?Today's Vitals: BP 132/86 (BP Location: Right Arm, Patient Position: Sitting, Cuff Size: Large)   Pulse 72   Temp 98 ?F (36.7 ?C) (Oral)   Ht 5' 0.63" (1.54 m)   Wt 185 lb (83.9 kg)   SpO2 97%   BMI 35.38 kg/m?  ? ?  02/02/2022  ?  2:01 PM 11/29/2020  ?  9:11 AM 11/29/2020  ?  8:37 AM  ?Vitals with BMI  ?Height 5' 0.63"  5' 1"   ?Weight 185 lbs  172 lbs  ?BMI 35.38  32.52  ?Systolic 741 423 953  ?Diastolic 86 84 84  ?Pulse 72  72  ?  ? ?Physical Exam ?Vitals reviewed.  ?Constitutional:   ?   General: She is not in acute distress. ?   Appearance: Normal appearance.  ?HENT:  ?   Head: Normocephalic and atraumatic.  ?Neck:  ?   Vascular: No carotid bruit.  ?Cardiovascular:  ?   Rate and Rhythm: Normal rate and regular rhythm.  ?   Pulses: Normal pulses.  ?   Heart sounds: Normal heart sounds.  ?Pulmonary:  ?   Effort: Pulmonary effort is normal.  ?   Breath sounds: Normal breath sounds.  ?Skin: ?   General: Skin is warm and dry.  ?   Comments: No obvious rash noted on exam, some possible papules noted on bilateral arms, all of which were pinprick in size  ?Neurological:  ?   General: No focal deficit present.  ?   Mental Status: She is alert and oriented to person, place, and time.  ?Psychiatric:     ?   Mood and Affect: Mood normal.     ?   Behavior: Behavior normal.     ?   Judgment: Judgment normal.  ? ? ? ? ? ? ? ?Assessment and Plan  ? ?1. Weight gain   ?2. Chronic renal failure, stage 3b ([place])   ?3. Rash   ?4. Prediabetes   ?5. Screening, lipid   ?6. Colon cancer screening   ?7. Encounter for screening mammogram for malignant neoplasm of breast   ? ? ? ?Plan: ?See plan via problem list below. ? ? ?Tests ordered ?Orders Placed This Encounter  ?Procedures  ? MM DIGITAL SCREENING BILATERAL  ? TSH  ? Hemoglobin A1c  ? Lipid panel  ? Comprehensive metabolic panel  ? CBC with Differential/Platelet  ? T3, free  ? T4, free  ? Microalbumin / creatinine urine ratio  ? Ambulatory  referral to Gastroenterology  ? ? ? ? ?Meds ordered this encounter  ?Medications  ? triamcinolone cream (KENALOG) 0.1 %  ?  Sig: Apply 1 application. topically 2 (two) times daily.  ?  Dispense:  30 g  ?  Refill:  0  ?  Order Specific Question:   Supervising Provider  ?  Answer:   Binnie Rail [[id number]]  ? ? ?Patient to follow-up in 3 weeks, or sooner as needed. ? ?Ailene Ards, NP' ?

## 2022-02-02 NOTE — Assessment & Plan Note (Signed)
Bilateral screening mammogram ordered today. ?

## 2022-02-02 NOTE — Assessment & Plan Note (Signed)
Referral to gastroenterology ordered today. ?

## 2022-02-02 NOTE — Assessment & Plan Note (Signed)
Chronic, will check metabolic panel as well as urine to look for albuminuria.  Patient will continue to follow-up with nephrology as scheduled.  Further recommendations may be made based upon lab results. ?

## 2022-02-02 NOTE — Assessment & Plan Note (Signed)
Chronic, stable.  For now patient will continue on lisinopril 40 mg mouth daily and amlodipine 5 mg by mouth daily.  We will check blood work for further evaluation, and further recommendations will be made based upon the results.  I anticipate changing her from lisinopril to another agent.  Patient will follow-up in about 3 weeks for close monitoring of blood pressure. ?

## 2022-02-02 NOTE — Assessment & Plan Note (Signed)
Lipid panel ordered today, further recommendations may be made based upon these results. ?

## 2022-02-08 ENCOUNTER — Other Ambulatory Visit: Payer: Self-pay | Admitting: Nurse Practitioner

## 2022-02-08 DIAGNOSIS — I10 Essential (primary) hypertension: Secondary | ICD-10-CM

## 2022-02-08 MED ORDER — OLMESARTAN MEDOXOMIL 20 MG PO TABS: 20.0000 mg | ORAL_TABLET | Freq: Every day | ORAL | 1 refills | Status: DC

## 2022-02-23 ENCOUNTER — Ambulatory Visit (INDEPENDENT_AMBULATORY_CARE_PROVIDER_SITE_OTHER): Payer: BC Managed Care – PPO | Admitting: Nurse Practitioner

## 2022-02-23 ENCOUNTER — Encounter: Payer: Self-pay | Admitting: Nurse Practitioner

## 2022-02-23 VITALS — BP 120/80 | HR 92 | Temp 98.2°F | Ht 60.63 in | Wt 191.0 lb

## 2022-02-23 DIAGNOSIS — R768 Other specified abnormal immunological findings in serum: Secondary | ICD-10-CM | POA: Insufficient documentation

## 2022-02-23 DIAGNOSIS — M7989 Other specified soft tissue disorders: Secondary | ICD-10-CM | POA: Diagnosis not present

## 2022-02-23 DIAGNOSIS — E119 Type 2 diabetes mellitus without complications: Secondary | ICD-10-CM

## 2022-02-23 DIAGNOSIS — R635 Abnormal weight gain: Secondary | ICD-10-CM | POA: Diagnosis not present

## 2022-02-23 DIAGNOSIS — E669 Obesity, unspecified: Secondary | ICD-10-CM | POA: Insufficient documentation

## 2022-02-23 DIAGNOSIS — R6889 Other general symptoms and signs: Secondary | ICD-10-CM | POA: Diagnosis not present

## 2022-02-23 DIAGNOSIS — I10 Essential (primary) hypertension: Secondary | ICD-10-CM

## 2022-02-23 DIAGNOSIS — Z0001 Encounter for general adult medical examination with abnormal findings: Secondary | ICD-10-CM | POA: Diagnosis not present

## 2022-02-23 DIAGNOSIS — Z136 Encounter for screening for cardiovascular disorders: Secondary | ICD-10-CM | POA: Insufficient documentation

## 2022-02-23 DIAGNOSIS — N1832 Chronic kidney disease, stage 3b: Secondary | ICD-10-CM | POA: Insufficient documentation

## 2022-02-23 NOTE — Patient Instructions (Addendum)
[facility]:  [phone]; call to scheduled appt to discuss Colon Cancer Screening ? ?[facility]:  [phone] ; call to schedule your mammogram. The order is already placed in the system ? ?Would like to order a BMP with eGFR or CMP (whichever your lab offers). Ask them to let me know if I need to order it via [place] or [employer]. ? ?Preventive Care 58-58 Years Old, Female ?Preventive care refers to lifestyle choices and visits with your health care provider that can promote health and wellness. Preventive care visits are also called wellness exams. ?What can I expect for my preventive care visit? ?Counseling ?Your health care provider may ask you questions about your: ?Medical history, including: ?Past medical problems. ?Family medical history. ?Pregnancy history. ?Current health, including: ?Menstrual cycle. ?Method of birth control. ?Emotional well-being. ?Home life and relationship well-being. ?Sexual activity and sexual health. ?Lifestyle, including: ?Alcohol, nicotine or tobacco, and drug use. ?Access to firearms. ?Diet, exercise, and sleep habits. ?Work and work Statistician. ?Sunscreen use. ?Safety issues such as seatbelt and bike helmet use. ?Physical exam ?Your health care provider will check your: ?Height and weight. These may be used to calculate your BMI (body mass index). BMI is a measurement that tells if you are at a healthy weight. ?Waist circumference. This measures the distance around your waistline. This measurement also tells if you are at a healthy weight and may help predict your risk of certain diseases, such as type 2 diabetes and high blood pressure. ?Heart rate and blood pressure. ?Body temperature. ?Skin for abnormal spots. ?What immunizations do I need? ? ?Vaccines are usually given at various ages, according to a schedule. Your health care provider will recommend vaccines for you based on your age, medical history, and lifestyle or other factors, such as travel or where you  work. ?What tests do I need? ?Screening ?Your health care provider may recommend screening tests for certain conditions. This may include: ?Lipid and cholesterol levels. ?Diabetes screening. This is done by checking your blood sugar (glucose) after you have not eaten for a while (fasting). ?Pelvic exam and Pap test. ?Hepatitis B test. ?Hepatitis C test. ?HIV (human immunodeficiency virus) test. ?STI (sexually transmitted infection) testing, if you are at risk. ?Lung cancer screening. ?Colorectal cancer screening. ?Mammogram. Talk with your health care provider about when you should start having regular mammograms. This may depend on whether you have a family history of breast cancer. ?BRCA-related cancer screening. This may be done if you have a family history of breast, ovarian, tubal, or peritoneal cancers. ?Bone density scan. This is done to screen for osteoporosis. ?Talk with your health care provider about your test results, treatment options, and if necessary, the need for more tests. ?Follow these instructions at home: ?Eating and drinking ? ?Eat a diet that includes fresh fruits and vegetables, whole grains, lean protein, and low-fat dairy products. ?Take vitamin and mineral supplements as recommended by your health care provider. ?Do not drink alcohol if: ?Your health care provider tells you not to drink. ?You are pregnant, may be pregnant, or are planning to become pregnant. ?If you drink alcohol: ?Limit how much you have to 0-1 drink a day. ?Know how much alcohol is in your drink. In the [place] one drink equals one 12 oz bottle of beer (355 mL), one 5 oz glass of wine (148 mL), or one 1? oz glass of hard liquor (44 mL). ?Lifestyle ?Brush your teeth every morning and night with fluoride toothpaste. Floss one time  each day. ?Exercise for at least 30 minutes 5 or more days each week. ?Do not use any products that contain nicotine or tobacco. These products include cigarettes, chewing tobacco, and vaping  devices, such as e-cigarettes. If you need help quitting, ask your health care provider. ?Do not use drugs. ?If you are sexually active, practice safe sex. Use a condom or other form of protection to prevent STIs. ?If you do not wish to become pregnant, use a form of birth control. If you plan to become pregnant, see your health care provider for a prepregnancy visit. ?Take aspirin only as told by your health care provider. Make sure that you understand how much to take and what form to take. Work with your health care provider to find out whether it is safe and beneficial for you to take aspirin daily. ?Find healthy ways to manage stress, such as: ?Meditation, yoga, or listening to music. ?Journaling. ?Talking to a trusted person. ?Spending time with friends and family. ?Minimize exposure to UV radiation to reduce your risk of skin cancer. ?Safety ?Always wear your seat belt while driving or riding in a vehicle. ?Do not drive: ?If you have been drinking alcohol. Do not ride with someone who has been drinking. ?When you are tired or distracted. ?While texting. ?If you have been using any mind-altering substances or drugs. ?Wear a helmet and other protective equipment during sports activities. ?If you have firearms in your house, make sure you follow all gun safety procedures. ?Seek help if you have been physically or sexually abused. ?What's next? ?Visit your health care provider once a year for an annual wellness visit. ?Ask your health care provider how often you should have your eyes and teeth checked. ?Stay up to date on all vaccines. ?This information is not intended to replace advice given to you by your health care provider. Make sure you discuss any questions you have with your health care provider. ?Document Revised: 03/29/2021 Document Reviewed: 03/29/2021 ?Elsevier Patient Education ? [employer]. ? ? ?Mediterranean Diet ?A Mediterranean diet refers to food and lifestyle choices that are based on  the traditions of countries located on the [employer]. It focuses on eating more fruits, vegetables, whole grains, beans, nuts, seeds, and heart-healthy fats, and eating less dairy, meat, eggs, and processed foods with added sugar, salt, and fat. This way of eating has been shown to help prevent certain conditions and improve outcomes for people who have chronic diseases, like kidney disease and heart disease. ?What are tips for following this plan? ?Reading food labels ?Check the serving size of packaged foods. For foods such as rice and pasta, the serving size refers to the amount of cooked product, not dry. ?Check the total fat in packaged foods. Avoid foods that have saturated fat or trans fats. ?Check the ingredient list for added sugars, such as corn syrup. ?Shopping ? ?Buy a variety of foods that offer a balanced diet, including: ?Fresh fruits and vegetables (produce). ?Grains, beans, nuts, and seeds. Some of these may be available in unpackaged forms or large amounts (in bulk). ?Fresh seafood. ?Poultry and eggs. ?Low-fat dairy products. ?Buy whole ingredients instead of prepackaged foods. ?Buy fresh fruits and vegetables in-season from local farmers markets. ?Buy plain frozen fruits and vegetables. ?If you do not have access to quality fresh seafood, buy precooked frozen shrimp or canned fish, such as tuna, salmon, or sardines. ?Stock your pantry so you always have certain foods on hand, such as olive oil, canned tuna,  canned tomatoes, rice, pasta, and beans. ?Cooking ?Cook foods with extra-virgin olive oil instead of using butter or other vegetable oils. ?Have meat as a side dish, and have vegetables or grains as your main dish. This means having meat in small portions or adding small amounts of meat to foods like pasta or stew. ?Use beans or vegetables instead of meat in common dishes like chili or lasagna. ?Experiment with different cooking methods. Try roasting, broiling, steaming, and saut?ing  vegetables. ?Add frozen vegetables to soups, stews, pasta, or rice. ?Add nuts or seeds for added healthy fats and plant protein at each meal. You can add these to yogurt, salads, or vegetable dishes. ?Marinate

## 2022-02-23 NOTE — Progress Notes (Signed)
? ? ? ?Subjective:  ?Patient ID: [name], female    DOB: 09-06-1966  Age: 56 y.o. MRN: [id number] ? ?CC:  ?Chief Complaint  ?Patient presents with  ? Annual Exam  ?  ? ? ?HPI  ?This patient arrives today for the above. ? ?Health maintenance: She is due for screening mammogram and screening colonoscopy.  Referrals have been made, she has not yet heard about scheduling for mammogram or GI appointment.   ? ?Swelling: She continues to experience swelling in her lower extremities as well as weight gain.  She has reduced exercise tolerance, and gets fatigued and short of breath easier than she used to.  She denies chest pain. ? ?Type 2 diabetes: New diagnosis, she has a history of prediabetes.  Last office visit blood work was collected which showed A1c of 6.7.  She has been prescribed metformin 500 mg XR by mouth daily, but has not been taking it regularly.  Current EGFR is 42.  She is not on statin currently, last lipid panel showed LDL of 106.  She is on olmesartan. ? ?Hypertension: She continues on amlodipine 5 mg a mouth daily, hydrochlorothiazide 12.5 mg mouth daily, and we switched her from lisinopril to olmesartan at last office visit due to cough.  She is on olmesartan 20 mg by mouth daily.  She is tolerating medication well. ? ?Chronic kidney disease: She sees nephrology, last EGFR was 42. ? ?Past Medical History:  ?Diagnosis Date  ? Essential hypertension   ? Fibroids   ? IDA (iron deficiency anemia)   ? Neck mass   ? posterior  ? Scalp mass 02/25/2012  ? ? ? ? ?Family History  ?Problem Relation Age of Onset  ? Heart disease Father   ? Hypertension Father   ? Stroke Father   ? Hypertension Mother   ? Multiple sclerosis Mother   ? Cancer Maternal Grandmother   ?     ?type  ? Diabetes Maternal Grandfather   ? Healthy Paternal Grandmother   ? Hypertension Brother   ? Asthma Sister   ? Emphysema Sister   ? Lung cancer Sister   ? Sickle cell trait Daughter   ? ? ?Social History  ? ?Social History  Narrative  ? Fun: Family time, movies and relaxing, outdoors  ? Feels safe at home and denies abuse  ? ?Social History  ? ?Tobacco Use  ? Smoking status: Every Day  ?  Types: E-cigarettes, Cigarettes  ? Smokeless tobacco: Current  ?Substance Use Topics  ? Alcohol use: Yes  ?  Comment: occ  ? ? ? ?Current Meds  ?Medication Sig  ? amLODipine (NORVASC) 5 MG tablet Take 5 mg by mouth daily.  ? cholecalciferol (VITAMIN D3) 25 MCG (1000 UT) tablet Take 1,000 Units by mouth daily.  ? hydrochlorothiazide (MICROZIDE) 12.5 MG capsule Take 12.5 mg by mouth daily.  ? Multiple Vitamins-Minerals (WOMENS MULTIVITAMIN) TABS See admin instructions.  ? olmesartan (BENICAR) 20 MG tablet Take 1 tablet (20 mg total) by mouth daily.  ? triamcinolone cream (KENALOG) 0.1 % Apply 1 application. topically 2 (two) times daily.  ? [DISCONTINUED] metFORMIN (GLUCOPHAGE-XR) 500 MG 24 hr tablet Take 500 mg by mouth daily with breakfast.  ? ? ?ROS:  ?Review of Systems  ?Constitutional:  Positive for malaise/fatigue (decreased exercise tolerance). Negative for weight loss (weight gain, unintentional).  ?Respiratory:  Positive for shortness of breath (earlier this week has now resolved). Negative for cough and sputum production.   ?Cardiovascular:  Positive for chest pain (earlier this week, now resolved), palpitations (seems to have stopped) and leg swelling.  ?Gastrointestinal:  Negative for abdominal pain and blood in stool.  ?Neurological:  Negative for dizziness and headaches.  ?Psychiatric/Behavioral:  Negative for depression and suicidal ideas.   ? ? ?Objective:  ? ?Today's Vitals: BP 120/80 (BP Location: Left Arm, Patient Position: Sitting, Cuff Size: Normal)   Pulse 92   Temp 98.2 ?F (36.8 ?C) (Oral)   Ht 5' 0.63" (1.54 m)   Wt 191 lb (86.6 kg)   SpO2 94%   BMI 36.53 kg/m?  ? ?  02/23/2022  ?  4:38 PM 02/02/2022  ?  2:01 PM 11/29/2020  ?  9:11 AM  ?Vitals with BMI  ?Height 5' 0.63" 5' 0.63"   ?Weight 191 lbs 185 lbs   ?BMI 36.53 35.38    ?Systolic 711 657 903  ?Diastolic 80 86 84  ?Pulse 92 72   ?  ? ?Physical Exam ?Vitals reviewed.  ?Constitutional:   ?   Appearance: Normal appearance.  ?HENT:  ?   Head: Normocephalic and atraumatic.  ?   Right Ear: Tympanic membrane, ear canal and external ear normal.  ?   Left Ear: Tympanic membrane, ear canal and external ear normal.  ?Eyes:  ?   General:     ?   Right eye: No discharge.     ?   Left eye: No discharge.  ?   Extraocular Movements: Extraocular movements intact.  ?   Conjunctiva/sclera: Conjunctivae normal.  ?   Pupils: Pupils are equal, round, and reactive to light.  ?Neck:  ?   Vascular: No carotid bruit.  ?Cardiovascular:  ?   Rate and Rhythm: Normal rate and regular rhythm.  ?   Pulses: Normal pulses.  ?   Heart sounds: Normal heart sounds. No murmur heard. ?Pulmonary:  ?   Effort: Pulmonary effort is normal.  ?   Breath sounds: Normal breath sounds.  ?Chest:  ?Breasts: ?   Breasts are symmetrical.  ?   Right: Normal.  ?   Left: Normal.  ?Abdominal:  ?   General: Abdomen is flat. Bowel sounds are normal. There is no distension.  ?   Palpations: Abdomen is soft. There is no mass.  ?   Tenderness: There is no abdominal tenderness.  ?Musculoskeletal:     ?   General: No tenderness.  ?   Cervical back: Neck supple. No muscular tenderness.  ?   Right lower leg: Edema present.  ?   Left lower leg: Edema present.  ?Lymphadenopathy:  ?   Cervical: No cervical adenopathy.  ?   Upper Body:  ?   Right upper body: No supraclavicular adenopathy.  ?   Left upper body: No supraclavicular adenopathy.  ?Skin: ?   General: Skin is warm and dry.  ?Neurological:  ?   General: No focal deficit present.  ?   Mental Status: She is alert and oriented to person, place, and time.  ?   Motor: No weakness.  ?   Gait: Gait normal.  ?Psychiatric:     ?   Mood and Affect: Mood normal.     ?   Behavior: Behavior normal.     ?   Judgment: Judgment normal.  ? ? ? ?  02/23/2022  ?  4:36 PM  ?PHQ9 SCORE ONLY  ?PHQ-9 Total Score 0   ? ? ? ?EKG: NSR ? ? ?Assessment and Plan  ? ?1. Encounter  for general adult medical examination with abnormal findings   ?2. Screening for cardiovascular condition   ?3. Weight gain   ?4. Hypertension, unspecified type   ?5. Exercise intolerance   ?6. Localized swelling of both lower extremities   ?7. Type 2 diabetes mellitus without complication, without long-term current use of insulin ([place])   ?8. Stage 3b chronic kidney disease ([place])   ? ? ? ?Plan: ?1.  Counseled on healthy diet and to participate in routine exercise.  She was provided with phone number to GI office as well as to breast imaging center to schedule appointment to discuss colon cancer screening and appointment to have mammogram completed. ?2.-3.,  5.-6.  She has multiple risk factors for cardiovascular disease, with swelling and weight gain as well as exercise intolerance we will refer to cardiology to consider undergoing echocardiogram for further evaluation.  EKG completed today which showed normal sinus rhythm without ST segment abnormalities nor signs of enlarged left ventricle.  Pending repeat metabolic panel results may consider increasing hydrochlorothiazide while waiting for further evaluation by cardiology to see if this will help with the swelling in her lower extremities. ?4.  Blood pressure much improved with switch to olmesartan.  She also reports she is no longer coughing.  She will return to office early next week or have blood collected at her lab at her occupational medical provider to monitor kidney function and electrolytes. ?7.  We discussed new diagnosis of type 2 diabetes.  Based on eGFR would recommend against continue metformin.  For now recommend lifestyle modification.  May consider starting on Ozempic pending repeat A1c result.  She will continue on ARB.  May need to consider starting on statin therapy. ?8.  She is encouraged to follow-up with nephrology as scheduled. ?  ? ?Tests ordered ?Orders Placed This Encounter   ?Procedures  ? Basic Metabolic Panel (BMET)  ? Comp Met (CMET)  ? Comp Met (CMET)  ? Ambulatory referral to Cardiology  ? EKG 12-Lead  ? ? ? ? ?No orders of the defined types were placed in this encounter. ? ? ?Pati

## 2022-02-28 ENCOUNTER — Encounter: Payer: Self-pay | Admitting: Nurse Practitioner

## 2022-03-01 NOTE — Telephone Encounter (Signed)
Pt called back in and states she wants orders sent to [place].   She states she was not in front of her notes to know the exact fax number, but is almost for certain the one listed is correct.   Callback with any questions.

## 2022-03-09 ENCOUNTER — Ambulatory Visit: Payer: BC Managed Care – PPO

## 2022-03-14 NOTE — Progress Notes (Unsigned)
Cardiology Office Note:    Date:  03/14/2022   ID:  [name], DOB 07/11/1966, MRN [id number]  PCP:  Ailene Ards, NP   [facility] Providers Cardiologist:  None {  Referring MD: Ailene Ards, NP    History of Present Illness:    [name] is a 56 y.o. female with a hx of HTN, CKD IIIB, and DMII who was referred by Jeralyn Ruths, NP for further evaluation of LE edema and fatigue.  Today, ***  Past Medical History:  Diagnosis Date   Essential hypertension    Fibroids    IDA (iron deficiency anemia)    Neck mass    posterior   Scalp mass 02/25/2012    Past Surgical History:  Procedure Laterality Date   cervix cerclage  1987   LIPOSUCTION N/A 08/17/2019   Procedure: LIPOSUCTION POSTERIOR NECK;  Surgeon: Cristine Polio, MD;  Location: [place];  Service: Plastics;  Laterality: N/A;   TOTAL ABDOMINAL HYSTERECTOMY  04/13/2005   TUBAL LIGATION     WISDOM TOOTH EXTRACTION      Current Medications: No outpatient medications have been marked as taking for the 03/20/22 encounter (Appointment) with Freada Bergeron, MD.     Allergies:   Tylox [oxycodone-acetaminophen] and Tyloxapol   Social History   Socioeconomic History   Marital status: Single    Spouse name: Not on file   Number of children: 3   Years of education: 14   Highest education level: Not on file  Occupational History   Occupation: Coordinator  Tobacco Use   Smoking status: Every Day    Types: E-cigarettes, Cigarettes   Smokeless tobacco: Current  Vaping Use   Vaping Use: Every day  Substance and Sexual Activity   Alcohol use: Yes    Comment: occ   Drug use: No   Sexual activity: Not Currently    Birth control/protection: Surgical    Comment: 1st intercourse 14 yo-5 partners  Other Topics Concern   Not on file  Social History Narrative   Fun: Family time, movies and relaxing, outdoors   Feels safe at home and denies abuse   Social Determinants of  Radio broadcast assistant Strain: Not on file  Food Insecurity: Not on file  Transportation Needs: Not on file  Physical Activity: Not on file  Stress: Not on file  Social Connections: Not on file     Family History: The patient's ***family history includes Asthma in her sister; Cancer in her maternal grandmother; Diabetes in her maternal grandfather; Emphysema in her sister; Healthy in her paternal grandmother; Heart disease in her father; Hypertension in her brother, father, and mother; Lung cancer in her sister; Multiple sclerosis in her mother; Sickle cell trait in her daughter; Stroke in her father.  ROS:   Please see the history of present illness.    *** All other systems reviewed and are negative.  EKGs/Labs/Other Studies Reviewed:    The following studies were reviewed today: ***  EKG:  EKG is *** ordered today.  The ekg ordered today demonstrates ***  Recent Labs: 02/02/2022: ALT 19; BUN 14; Creatinine, Ser 1.37; Hemoglobin 11.3; Platelets 326.0; Potassium 3.8; Sodium 137; TSH 0.41  Recent Lipid Panel    Component Value Date/Time   CHOL 174 02/02/2022 1504   TRIG 51.0 02/02/2022 1504   HDL 57.90 02/02/2022 1504   CHOLHDL 3 02/02/2022 1504   VLDL 10.2 02/02/2022 1504   LDLCALC 106 (H) 02/02/2022 1504  LDLCALC 141 (H) 11/24/2019 0931     Risk Assessment/Calculations:   {Does this patient have ATRIAL FIBRILLATION?:[phone]}       Physical Exam:    VS:  There were no vitals taken for this visit.    Wt Readings from Last 3 Encounters:  02/23/22 191 lb (86.6 kg)  02/02/22 185 lb (83.9 kg)  11/29/20 172 lb (78 kg)     GEN: *** Well nourished, well developed in no acute distress HEENT: Normal NECK: No JVD; No carotid bruits LYMPHATICS: No lymphadenopathy CARDIAC: ***RRR, no murmurs, rubs, gallops RESPIRATORY:  Clear to auscultation without rales, wheezing or rhonchi  ABDOMEN: Soft, non-tender, non-distended MUSCULOSKELETAL:  No edema; No deformity   SKIN: Warm and dry NEUROLOGIC:  Alert and oriented x 3 PSYCHIATRIC:  Normal affect   ASSESSMENT:    No diagnosis found. PLAN:    In order of problems listed above:  #LE Edema: #Worsening Fatigue: -Check TTE -Check coronary CTA  #HTN: -Continue olmesartan '20mg'$  daily -Continue amlodipine '5mg'$  daily -Continue HCTZ 12.'5mg'$  daily  #HLD: -Start crestor '10mg'$  daily  #DMII: -Start ozempic (???)  #CKD IIIB: -Followed by Nephrology  #Obesity: BMI 37.       {Are you ordering a CV Procedure (e.g. stress test, cath, DCCV, TEE, etc)?   Press F2        :[id number]}    Medication Adjustments/Labs and Tests Ordered: Current medicines are reviewed at length with the patient today.  Concerns regarding medicines are outlined above.  No orders of the defined types were placed in this encounter.  No orders of the defined types were placed in this encounter.   There are no Patient Instructions on file for this visit.   Signed, Freada Bergeron, MD  03/14/2022 2:57 PM    [facility]

## 2022-03-16 ENCOUNTER — Encounter: Payer: Self-pay | Admitting: Nurse Practitioner

## 2022-03-16 ENCOUNTER — Telehealth: Payer: Self-pay | Admitting: Nurse Practitioner

## 2022-03-16 ENCOUNTER — Ambulatory Visit (INDEPENDENT_AMBULATORY_CARE_PROVIDER_SITE_OTHER): Payer: BC Managed Care – PPO | Admitting: Nurse Practitioner

## 2022-03-16 VITALS — BP 112/80 | HR 87 | Temp 98.2°F | Ht 60.63 in | Wt 192.8 lb

## 2022-03-16 DIAGNOSIS — E119 Type 2 diabetes mellitus without complications: Secondary | ICD-10-CM

## 2022-03-16 DIAGNOSIS — E669 Obesity, unspecified: Secondary | ICD-10-CM

## 2022-03-16 DIAGNOSIS — E785 Hyperlipidemia, unspecified: Secondary | ICD-10-CM | POA: Diagnosis not present

## 2022-03-16 DIAGNOSIS — I10 Essential (primary) hypertension: Secondary | ICD-10-CM

## 2022-03-16 DIAGNOSIS — N1832 Chronic kidney disease, stage 3b: Secondary | ICD-10-CM

## 2022-03-16 MED ORDER — BLOOD GLUCOSE MONITOR KIT: PACK | 0 refills | Status: AC

## 2022-03-16 NOTE — Assessment & Plan Note (Signed)
We also discussed possibly starting statin therapy, patient would however like to focus on lifestyle measures first.  We will plan on rechecking A1c and lipid panel at next office visit and revisit need to start pharmacological therapy for hyperlipidemia.

## 2022-03-16 NOTE — Assessment & Plan Note (Signed)
Chronic, at goal.  Patient to continue on current medication therapy as prescribed (amlodipine 5 mg/day, HCTZ 12.5 mg/day, and olmesartan 20 mg/day).  Patient aware that she needs metabolic panel collected as soon as possible.  She was given order requisitions to bring to her lab.  She will plan on getting this done over the weekend.

## 2022-03-16 NOTE — Assessment & Plan Note (Signed)
She will focus on lifestyle measures including diet and increasing physical activity as tolerated.  Referral to dietitian made today.  May consider Ozempic in the future to treat diabetes as well as help with weight loss.

## 2022-03-16 NOTE — Assessment & Plan Note (Signed)
Chronic, stable.  Awaiting CMP results for further evaluation.  Patient will follow-up with nephrology as scheduled.  I encouraged her to discuss possibly starting SGLT2 with her nephrologist as this may be beneficial for kidney health especially considering she is a type II diabetic.  She reports her understanding and states that she will discuss this with her nephrologist.

## 2022-03-16 NOTE — Assessment & Plan Note (Signed)
Chronic, stable.  Continue ARB.  Patient told importance of getting metabolic panel collected due to risk of electrolyte abnormalities or worsening kidney function with starting new ARB.  She reports understanding will try to get labs drawn this weekend.  We had a long discussion of risk versus benefits of starting pharmacological therapy.  I recommend considering starting GLP-1 versus SGLT2 to help with blood sugar control as well as to help with weight loss and possibly protect kidney function.  Patient is highly motivated to focus on lifestyle changes and would prefer to make these changes prior to starting medication if possible.  Patient referred to dietitian and encouraged to increase physical activity to 150 minutes a week.  She reports understanding.  We will also prescribe glucometer so she can check a daily fasting blood sugar.  We also discussed possibly starting statin therapy, patient would however like to focus on lifestyle measures first.  We will plan on rechecking A1c and lipid panel at next office visit and revisit need to start pharmacological therapy for diabetes and/or hyperlipidemia.

## 2022-03-16 NOTE — Telephone Encounter (Signed)
We please look into referral to GI for colon cancer screening.  Patient reports she has not heard from this office to get this scheduled.  Also I ordered screening mammogram but I do not see where this has been scheduled with you see if this has been approved by insurance?  Thank you.

## 2022-03-16 NOTE — Patient Instructions (Addendum)
Ask nephrologist if they think being on SGLT2 [place]) would be a good option for kidney health.   Fasting Blood Glucose Goal: 80-130  2 hours after a meal: <180

## 2022-03-16 NOTE — Progress Notes (Signed)
Established Patient Office Visit  Subjective   Patient ID: [name], female    DOB: Jul 08, 1966  Age: 56 y.o. MRN: [id number]  Chief Complaint  Patient presents with   Follow-up   Diabetes   Diabetes/Hyperlipidemia: Patient recently diagnosed with type 2 diabetes.  Last A1c collected about 6 weeks ago and it was 6.7.  eGFR at that time was 42, she is no longer on metformin.  She is not on statin therapy at this time, last lipid panel showed LDL of 106.  ASCVD risk score is approximately 14%.  She is on olmesartan.  This was recently changed from lisinopril, she has not had follow-up metabolic panel due to issue with having the order faxed to her work where she gets her blood work drawn.    Fatigue/lower extremity swelling/hypertension: She has been referred to cardiology for evaluation of her lower extremity swelling and increased fatigue.  She has appointment coming up shortly.  She continues on amlodipine 5 mg mouth daily, HCTZ 12.5 mg by mouth daily, and olmesartan 20 mg a mouth daily.  CKD 3B: She follows with nephrology on a regular basis.  She is due to have metabolic panel rechecked.  She continues on olmesartan.  Obesity: She is motivated to make lifestyle changes and plans on increasing her physical activity as tolerated.       Review of Systems  Respiratory:  Negative for shortness of breath.   Cardiovascular:  Negative for chest pain.  Neurological:  Negative for dizziness and headaches.     Objective:     BP 112/80 (BP Location: Left Arm, Patient Position: Sitting, Cuff Size: Normal)   Pulse 87   Temp 98.2 F (36.8 C) (Oral)   Ht 5' 0.63" (1.54 m)   Wt 192 lb 12.8 oz (87.5 kg)   SpO2 95%   BMI 36.88 kg/m    Physical Exam Vitals reviewed.  Constitutional:      General: She is not in acute distress.    Appearance: Normal appearance.  HENT:     Head: Normocephalic and atraumatic.  Neck:     Vascular: No carotid bruit.  Cardiovascular:      Rate and Rhythm: Normal rate and regular rhythm.     Pulses: Normal pulses.     Heart sounds: Normal heart sounds.  Pulmonary:     Effort: Pulmonary effort is normal.     Breath sounds: Normal breath sounds.  Skin:    General: Skin is warm and dry.  Neurological:     General: No focal deficit present.     Mental Status: She is alert and oriented to person, place, and time.  Psychiatric:        Mood and Affect: Mood normal.        Behavior: Behavior normal.        Judgment: Judgment normal.     No results found for any visits on 03/16/22.    The 10-year ASCVD risk score (Arnett DK, et al., 2019) is: 14%    Assessment & Plan:   Problem List Items Addressed This Visit       Cardiovascular and Mediastinum   Essential hypertension    Chronic, at goal.  Patient to continue on current medication therapy as prescribed (amlodipine 5 mg/day, HCTZ 12.5 mg/day, and olmesartan 20 mg/day).  Patient aware that she needs metabolic panel collected as soon as possible.  She was given order requisitions to bring to her lab.  She will plan on getting  this done over the weekend.         Endocrine   Type 2 diabetes mellitus without complication, without long-term current use of insulin (HCC) - Primary    Chronic, stable.  Continue ARB.  Patient told importance of getting metabolic panel collected due to risk of electrolyte abnormalities or worsening kidney function with starting new ARB.  She reports understanding will try to get labs drawn this weekend.  We had a long discussion of risk versus benefits of starting pharmacological therapy.  I recommend considering starting GLP-1 versus SGLT2 to help with blood sugar control as well as to help with weight loss and possibly protect kidney function.  Patient is highly motivated to focus on lifestyle changes and would prefer to make these changes prior to starting medication if possible.  Patient referred to dietitian and encouraged to increase physical  activity to 150 minutes a week.  She reports understanding.  We will also prescribe glucometer so she can check a daily fasting blood sugar.  We also discussed possibly starting statin therapy, patient would however like to focus on lifestyle measures first.  We will plan on rechecking A1c and lipid panel at next office visit and revisit need to start pharmacological therapy for diabetes and/or hyperlipidemia.       Relevant Medications   blood glucose meter kit and supplies KIT   Other Relevant Orders   Amb ref to Medical Nutrition Therapy-MNT     Genitourinary   Stage 3b chronic kidney disease (HCC)    Chronic, stable.  Awaiting CMP results for further evaluation.  Patient will follow-up with nephrology as scheduled.  I encouraged her to discuss possibly starting SGLT2 with her nephrologist as this may be beneficial for kidney health especially considering she is a type II diabetic.  She reports her understanding and states that she will discuss this with her nephrologist.         Other   Obesity    She will focus on lifestyle measures including diet and increasing physical activity as tolerated.  Referral to dietitian made today.  May consider Ozempic in the future to treat diabetes as well as help with weight loss.       Hyperlipidemia    We also discussed possibly starting statin therapy, patient would however like to focus on lifestyle measures first.  We will plan on rechecking A1c and lipid panel at next office visit and revisit need to start pharmacological therapy for hyperlipidemia.       Return in about 3 months (around 06/16/2022).  Total time spent working on visit today is approximately 45 minutes combining face-to-face interaction, reviewing previous records, risk versus benefit discussion of pharmacological medication for treatment plan of chronic conditions, discussed the importance of lab draw, and developing shared decision making plan regarding next steps in treatment  of her chronic conditions.   Ailene Ards, NP

## 2022-03-20 ENCOUNTER — Ambulatory Visit (INDEPENDENT_AMBULATORY_CARE_PROVIDER_SITE_OTHER): Payer: BC Managed Care – PPO | Admitting: Cardiology

## 2022-03-20 ENCOUNTER — Encounter: Payer: Self-pay | Admitting: *Deleted

## 2022-03-20 ENCOUNTER — Encounter: Payer: Self-pay | Admitting: Cardiology

## 2022-03-20 VITALS — BP 114/78 | HR 73 | Ht 60.63 in | Wt 194.4 lb

## 2022-03-20 DIAGNOSIS — R809 Proteinuria, unspecified: Secondary | ICD-10-CM | POA: Diagnosis not present

## 2022-03-20 DIAGNOSIS — N1832 Chronic kidney disease, stage 3b: Secondary | ICD-10-CM | POA: Diagnosis not present

## 2022-03-20 DIAGNOSIS — E1122 Type 2 diabetes mellitus with diabetic chronic kidney disease: Secondary | ICD-10-CM | POA: Diagnosis not present

## 2022-03-20 DIAGNOSIS — Z79899 Other long term (current) drug therapy: Secondary | ICD-10-CM

## 2022-03-20 DIAGNOSIS — E119 Type 2 diabetes mellitus without complications: Secondary | ICD-10-CM

## 2022-03-20 DIAGNOSIS — R600 Localized edema: Secondary | ICD-10-CM

## 2022-03-20 DIAGNOSIS — I10 Essential (primary) hypertension: Secondary | ICD-10-CM

## 2022-03-20 DIAGNOSIS — I129 Hypertensive chronic kidney disease with stage 1 through stage 4 chronic kidney disease, or unspecified chronic kidney disease: Secondary | ICD-10-CM | POA: Diagnosis not present

## 2022-03-20 DIAGNOSIS — E782 Mixed hyperlipidemia: Secondary | ICD-10-CM

## 2022-03-20 DIAGNOSIS — R0602 Shortness of breath: Secondary | ICD-10-CM | POA: Diagnosis not present

## 2022-03-20 DIAGNOSIS — E669 Obesity, unspecified: Secondary | ICD-10-CM

## 2022-03-20 DIAGNOSIS — N1831 Chronic kidney disease, stage 3a: Secondary | ICD-10-CM | POA: Diagnosis not present

## 2022-03-20 MED ORDER — AMLODIPINE BESYLATE 2.5 MG PO TABS: 2.5000 mg | ORAL_TABLET | Freq: Every day | ORAL | 3 refills | Status: DC

## 2022-03-20 MED ORDER — HYDROCHLOROTHIAZIDE 25 MG PO TABS: 25.0000 mg | ORAL_TABLET | Freq: Every day | ORAL | 3 refills | Status: DC

## 2022-03-20 NOTE — Progress Notes (Signed)
Cardiology Office Note:    Date:  03/20/2022   ID:  [name], DOB July 21, 1966, MRN [id number]  PCP:  Diana Ards, NP   [facility] Providers Cardiologist:  None {  Referring MD: Diana Ards, NP    History of Present Illness:    [name] is a 56 y.o. female with a hx of HTN, CKD IIIB, and DMII who was referred by Diana Ruths, NP for further evaluation of LE edema, dyspnea on exertion and fatigue.  At her appointment on 02/23/22 with Diana Ruths, NP she reported ongoing LE swelling and weight gain. She also noted reduced exercise tolerance with fatigue and shortness of breath. Her blood pressure had improved since lisinopril was switched to olmesartan due to cough. Due to multiple risk factors for cardiovascular disease and her symptoms she was referred to cardiology for further evaluation and consideration of echocardiogram.  Today, the patient states that she has struggled with swelling and weight gain for the past 1-2 years. Since then, she has noticed a decline in her overall energy. She continues to exercise and work on dietary changes with no significant improvement in her weight.  She is exercising routinely. She walks her dog, uses a treadmill and stepper at home. In the afternoons she tries to walk for a total of 30 minutes; she has tried to increase her steps to 12,000. While exercising, she will have some wheezing and frequently needs to sit down to catch her breath. She denies anginal symptoms. This is worse for her and she states she previously did not need to take breaks as often.  Recently she quit smoking.   In her family, her father was diagnosed with heart disease in his early 5's.  She would attribute her kidney disease to her history of uncontrolled hypertension. Lately she has noticed her blood pressure is becoming more stable on her current regimen.  She denies any palpitations, chest pain, or shortness of breath. No lightheadedness,  headaches, syncope, orthopnea, or PND.   Past Medical History:  Diagnosis Date   Encounter for screening mammogram for malignant neoplasm of breast 02/02/2022   Essential hypertension    Fibroids    IDA (iron deficiency anemia)    Neck mass    posterior   Scalp mass 02/25/2012    Past Surgical History:  Procedure Laterality Date   cervix cerclage  1987   LIPOSUCTION N/A 08/17/2019   Procedure: LIPOSUCTION POSTERIOR NECK;  Surgeon: Cristine Polio, MD;  Location: [facility];  Service: Plastics;  Laterality: N/A;   TOTAL ABDOMINAL HYSTERECTOMY  04/13/2005   TUBAL LIGATION     WISDOM TOOTH EXTRACTION      Current Medications: Current Meds  Medication Sig   amLODipine (NORVASC) 2.5 MG tablet Take 1 tablet (2.5 mg total) by mouth daily.   blood glucose meter kit and supplies KIT Dispense based on patient and insurance preference. Check blood sugar every day prior to first meal.   cholecalciferol (VITAMIN D3) 25 MCG (1000 UT) tablet Take 1,000 Units by mouth daily.   hydrochlorothiazide (HYDRODIURIL) 25 MG tablet Take 1 tablet (25 mg total) by mouth daily.   Multiple Vitamins-Minerals (WOMENS MULTIVITAMIN) TABS See admin instructions.   olmesartan (BENICAR) 20 MG tablet Take 1 tablet (20 mg total) by mouth daily.   triamcinolone cream (KENALOG) 0.1 % Apply 1 application. topically 2 (two) times daily.   [DISCONTINUED] amLODipine (NORVASC) 5 MG tablet Take 5 mg by mouth daily.   [DISCONTINUED]  hydrochlorothiazide (MICROZIDE) 12.5 MG capsule Take 12.5 mg by mouth daily.     Allergies:   Tylox [oxycodone-acetaminophen] and Tyloxapol   Social History   Socioeconomic History   Marital status: Single    Spouse name: Not on file   Number of children: 3   Years of education: 14   Highest education level: Not on file  Occupational History   Occupation: Coordinator  Tobacco Use   Smoking status: Every Day    Types: E-cigarettes, Cigarettes   Smokeless tobacco:  Current  Vaping Use   Vaping Use: Every day  Substance and Sexual Activity   Alcohol use: Yes    Comment: occ   Drug use: No   Sexual activity: Not Currently    Birth control/protection: Surgical    Comment: 1st intercourse 22 yo-5 partners  Other Topics Concern   Not on file  Social History Narrative   Fun: Family time, movies and relaxing, outdoors   Feels safe at home and denies abuse   Social Determinants of Radio broadcast assistant Strain: Not on file  Food Insecurity: Not on file  Transportation Needs: Not on file  Physical Activity: Not on file  Stress: Not on file  Social Connections: Not on file     Family History: The patient's family history includes Asthma in her sister; Cancer in her maternal grandmother; Diabetes in her maternal grandfather; Emphysema in her sister; Healthy in her paternal grandmother; Heart disease in her father; Hypertension in her brother, father, and mother; Lung cancer in her sister; Multiple sclerosis in her mother; Sickle cell trait in her daughter; Stroke in her father.  ROS:   Review of Systems  Constitutional:  Positive for malaise/fatigue. Negative for chills and fever.  HENT:  Negative for nosebleeds and tinnitus.   Eyes:  Negative for photophobia.  Respiratory:  Positive for shortness of breath and wheezing. Negative for cough and hemoptysis.   Cardiovascular:  Positive for leg swelling. Negative for chest pain, palpitations, orthopnea, claudication and PND.  Gastrointestinal:  Negative for blood in stool and diarrhea.  Genitourinary:  Negative for hematuria.  Musculoskeletal:  Negative for falls.  Neurological:  Positive for tingling (Upper and Lower extremities). Negative for speech change.  Endo/Heme/Allergies:  Negative for polydipsia.  Psychiatric/Behavioral:  Negative for depression and substance abuse.     EKGs/Labs/Other Studies Reviewed:    The following studies were reviewed today:  CT Abdomen   06/05/2021: FINDINGS: Lower chest: No acute abnormality.   Hepatobiliary: Hepatic steatosis. No focal liver abnormality is seen. No gallstones, gallbladder wall thickening, or biliary dilatation.   Pancreas: Unremarkable. No pancreatic ductal dilatation or surrounding inflammatory changes.   Spleen: Normal in size without focal abnormality.   Adrenals/Urinary Tract: Adrenal glands are unremarkable. No evidence of hydronephrosis. Duplex left kidney. Visualized portions of the left ureters are duplicated.   Stomach/Bowel: Stomach is within normal limits. Appendix is not included in the field of view. No evidence of bowel wall thickening, distention, or inflammatory changes.   Vascular/Lymphatic: No significant vascular findings are present. No enlarged abdominal lymph nodes.   Other: No abdominal wall hernia or abnormality.   Musculoskeletal: No acute or significant osseous findings.   IMPRESSION: No evidence of left renal mass. Masslike area deforming the renal sinus fat due to anatomic variant, duplex left kidney. Visualized portions of the left ureters are duplicated.   No hydronephrosis, nephrolithiasis or suspicious filling defects of the imaged portions of the renal collecting systems.   Hepatic steatosis.  EKG:  EKG is personally reviewed.  03/20/2022:  Sinus rhythm. Rate 73 bpm.   Recent Labs: 02/02/2022: ALT 19; BUN 14; Creatinine, Ser 1.37; Hemoglobin 11.3; Platelets 326.0; Potassium 3.8; Sodium 137; TSH 0.41   Recent Lipid Panel    Component Value Date/Time   CHOL 174 02/02/2022 1504   TRIG 51.0 02/02/2022 1504   HDL 57.90 02/02/2022 1504   CHOLHDL 3 02/02/2022 1504   VLDL 10.2 02/02/2022 1504   LDLCALC 106 (H) 02/02/2022 1504   LDLCALC 141 (H) 11/24/2019 0931     Risk Assessment/Calculations:           Physical Exam:    VS:  BP 114/78   Pulse 73   Ht 5' 0.63" (1.54 m)   Wt 194 lb 6.4 oz (88.2 kg)   SpO2 98%   BMI 37.18 kg/m     Wt  Readings from Last 3 Encounters:  03/20/22 194 lb 6.4 oz (88.2 kg)  03/16/22 192 lb 12.8 oz (87.5 kg)  02/23/22 191 lb (86.6 kg)     GEN: Well nourished, well developed in no acute distress HEENT: Normal NECK: No JVD; No carotid bruits LYMPHATICS: No lymphadenopathy CARDIAC: RRR, no murmurs, rubs, gallops RESPIRATORY:  Clear to auscultation without rales, wheezing or rhonchi  ABDOMEN: Soft, non-tender, non-distended MUSCULOSKELETAL:  Trace LE  edema; No deformity  SKIN: Warm and dry NEUROLOGIC:  Alert and oriented x 3 PSYCHIATRIC:  Normal affect   ASSESSMENT:    1. SOB (shortness of breath)   2. Bilateral lower extremity edema   3. Medication management   4. Primary hypertension   5. Stage 3b chronic kidney disease ([place])   6. Mixed hyperlipidemia   7. Diabetes mellitus with coincident hypertension ([place])   8. Obesity (BMI 35.0-39.9 without comorbidity)    PLAN:    In order of problems listed above:  #DOE: #Worsening Fatigue: Patient reports worsening dyspnea on exertion that has been ongoing for several months. No chest pain, however, given risk factors and family history of premature CAD, will check myoview and TTE for further evaluation. -Check TTE -Check exercise myoview (has CKD so will avoid CTA)  #LE Edema: Patient reports worsening LE edema over the past several months. Will obtain TTE as above and trial her on a lower dose of amlodipine to see if this helps. -Check TTE as above -Decrease amlodipine to 2.60m daily and increase HCTZ to 248mdaily -BMET next week  #HTN: Well controlled. Will decrease amlodipine and increase HCTZ as above due to LE edema. -Continue olmesartan 208maily -Decrease amlodipine to 2.5mg95mily and increase HCTZ to 25mg34mly  #HLD: -Follow-up CTA and will start statin pending results  #DMII: -Plans to start ozempic  #CKD IIIB: -Followed by Nephrology  #Obesity: BMI 37. Very active and is working on diet. Plans to start  ozempic. -Continue lifestyle modifications -Agree with PCP plan to start ozempic         Follow-up:  6 months.  Medication Adjustments/Labs and Tests Ordered: Current medicines are reviewed at length with the patient today.  Concerns regarding medicines are outlined above.   Orders Placed This Encounter  Procedures   Basic metabolic panel   MYOCARDIAL PERFUSION IMAGING   EKG 12-Lead   ECHOCARDIOGRAM COMPLETE   Meds ordered this encounter  Medications   hydrochlorothiazide (HYDRODIURIL) 25 MG tablet    Sig: Take 1 tablet (25 mg total) by mouth daily.    Dispense:  90 tablet    Refill:  3  Dose increased   amLODipine (NORVASC) 2.5 MG tablet    Sig: Take 1 tablet (2.5 mg total) by mouth daily.    Dispense:  90 tablet    Refill:  3    Dose decrease   Patient Instructions  Medication Instructions:   INCREASE YOUR HYDROCHLOROTHIAZIDE TO 25 MG BY MOUTH DAILY  DECREASE YOUR AMLODIPINE TO 2.5 MG BY MOUTH DAILY  *If you need a refill on your cardiac medications before your next appointment, please call your pharmacy*   Lab Work:  [place] WEEK--BMET  If you have labs (blood work) drawn today and your tests are completely normal, you will receive your results only by: [place] (if you have MyChart) OR A paper copy in the mail If you have any lab test that is abnormal or we need to change your treatment, we will call you to review the results.   Testing/Procedures:  Your physician has requested that you have an echocardiogram. Echocardiography is a painless test that uses sound waves to create images of your heart. It provides your doctor with information about the size and shape of your heart and how well your heart's chambers and valves are working. This procedure takes approximately one hour. There are no restrictions for this procedure.  Your physician has requested that you have a lexiscan myoview. For further information please visit  [email or web address]. Please follow instruction sheet, as given.    Follow-Up: At [facility], you and your health needs are our priority.  As part of our continuing mission to provide you with exceptional heart care, we have created designated Provider Care Teams.  These Care Teams include your primary Cardiologist (physician) and Advanced Practice Providers (APPs -  Physician Assistants and Nurse Practitioners) who all work together to provide you with the care you need, when you need it.  We recommend signing up for the patient portal called "MyChart".  Sign up information is provided on this After Visit Summary.  MyChart is used to connect with patients for Virtual Visits (Telemedicine).  Patients are able to view lab/test results, encounter notes, upcoming appointments, etc.  Non-urgent messages can be sent to your provider as well.   To learn more about what you can do with MyChart, go to [email or web address].    Your next appointment:   6 month(s)  The format for your next appointment:   In Person  Provider:   DR. Johney Frame OR AN EXTENDER   Important Information About Sugar         I,Mathew Stumpf,acting as a scribe for Freada Bergeron, MD.,have documented all relevant documentation on the behalf of Freada Bergeron, MD,as directed by  Freada Bergeron, MD while in the presence of Freada Bergeron, MD.  I, Freada Bergeron, MD, have reviewed all documentation for this visit. The documentation on 03/20/22 for the exam, diagnosis, procedures, and orders are all accurate and complete.   Signed, Freada Bergeron, MD  03/20/2022 5:16 PM    [place]

## 2022-03-20 NOTE — Patient Instructions (Signed)
Medication Instructions:   INCREASE YOUR HYDROCHLOROTHIAZIDE TO 25 MG BY MOUTH DAILY  DECREASE YOUR AMLODIPINE TO 2.5 MG BY MOUTH DAILY  *If you need a refill on your cardiac medications before your next appointment, please call your pharmacy*   Lab Work:  [place] WEEK--BMET  If you have labs (blood work) drawn today and your tests are completely normal, you will receive your results only by: [place] (if you have MyChart) OR A paper copy in the mail If you have any lab test that is abnormal or we need to change your treatment, we will call you to review the results.   Testing/Procedures:  Your physician has requested that you have an echocardiogram. Echocardiography is a painless test that uses sound waves to create images of your heart. It provides your doctor with information about the size and shape of your heart and how well your heart's chambers and valves are working. This procedure takes approximately one hour. There are no restrictions for this procedure.  Your physician has requested that you have a lexiscan myoview. For further information please visit [email or web address]. Please follow instruction sheet, as given.    Follow-Up: At [facility], you and your health needs are our priority.  As part of our continuing mission to provide you with exceptional heart care, we have created designated Provider Care Teams.  These Care Teams include your primary Cardiologist (physician) and Advanced Practice Providers (APPs -  Physician Assistants and Nurse Practitioners) who all work together to provide you with the care you need, when you need it.  We recommend signing up for the patient portal called "MyChart".  Sign up information is provided on this After Visit Summary.  MyChart is used to connect with patients for Virtual Visits (Telemedicine).  Patients are able to view lab/test results, encounter notes, upcoming appointments, etc.  Non-urgent messages  can be sent to your provider as well.   To learn more about what you can do with MyChart, go to [email or web address].    Your next appointment:   6 month(s)  The format for your next appointment:   In Person  Provider:   DR. Johney Frame OR AN EXTENDER   Important Information About Sugar

## 2022-04-06 ENCOUNTER — Other Ambulatory Visit (HOSPITAL_COMMUNITY): Payer: BC Managed Care – PPO

## 2022-04-06 ENCOUNTER — Encounter (HOSPITAL_COMMUNITY): Payer: BC Managed Care – PPO

## 2022-04-11 ENCOUNTER — Telehealth (HOSPITAL_COMMUNITY): Payer: Self-pay | Admitting: *Deleted

## 2022-04-11 NOTE — Telephone Encounter (Signed)
Patient given detailed instructions per Myocardial Perfusion Study Information Sheet for the test on 04/18/22 Patient notified to arrive 15 minutes early and that it is imperative to arrive on time for appointment to keep from having the test rescheduled.  If you need to cancel or reschedule your appointment, please call the office within 24 hours of your appointment. . Patient verbalized understanding.[name]

## 2022-04-18 ENCOUNTER — Ambulatory Visit (HOSPITAL_BASED_OUTPATIENT_CLINIC_OR_DEPARTMENT_OTHER): Payer: BC Managed Care – PPO

## 2022-04-18 ENCOUNTER — Ambulatory Visit (HOSPITAL_COMMUNITY): Payer: BC Managed Care – PPO | Attending: Cardiology

## 2022-04-18 DIAGNOSIS — R0602 Shortness of breath: Secondary | ICD-10-CM | POA: Diagnosis not present

## 2022-04-18 DIAGNOSIS — R600 Localized edema: Secondary | ICD-10-CM | POA: Insufficient documentation

## 2022-04-18 LAB — MYOCARDIAL PERFUSION IMAGING
Estimated workload: 9.3
Exercise duration (min): 7 min
Exercise duration (sec): 30 s
LV dias vol: 67 mL (ref 46–106)
LV sys vol: 28 mL
MPHR: 163 {beats}/min
Nuc Stress EF: 58 %
Peak HR: 157 {beats}/min
Percent HR: 96 %
Rest HR: 55 {beats}/min
Rest Nuclear Isotope Dose: 10.4 mCi
SDS: 0
SRS: 0
SSS: 0
ST Depression (mm): 0 mm
Stress Nuclear Isotope Dose: 31.8 mCi
TID: 0.95

## 2022-04-18 LAB — ECHOCARDIOGRAM COMPLETE
Area-P 1/2: 4.6 cm2
Height: 60 in
S' Lateral: 2.2 cm
Weight: 3104 oz

## 2022-04-18 MED ORDER — TECHNETIUM TC 99M TETROFOSMIN IV KIT
10.4000 | PACK | Freq: Once | INTRAVENOUS | Status: AC | PRN
  Administered 2022-04-18: 10.4 via INTRAVENOUS

## 2022-04-18 MED ORDER — TECHNETIUM TC 99M TETROFOSMIN IV KIT
31.8000 | PACK | Freq: Once | INTRAVENOUS | Status: AC | PRN
  Administered 2022-04-18: 31.8 via INTRAVENOUS

## 2022-05-23 ENCOUNTER — Ambulatory Visit: Payer: BC Managed Care – PPO | Admitting: Skilled Nursing Facility1

## 2022-06-15 ENCOUNTER — Ambulatory Visit: Payer: BC Managed Care – PPO | Admitting: Nurse Practitioner

## 2022-06-29 ENCOUNTER — Ambulatory Visit (INDEPENDENT_AMBULATORY_CARE_PROVIDER_SITE_OTHER): Payer: BC Managed Care – PPO | Admitting: Nurse Practitioner

## 2022-06-29 VITALS — BP 120/76 | HR 91 | Temp 98.3°F

## 2022-06-29 DIAGNOSIS — M6289 Other specified disorders of muscle: Secondary | ICD-10-CM | POA: Diagnosis not present

## 2022-06-29 DIAGNOSIS — E785 Hyperlipidemia, unspecified: Secondary | ICD-10-CM | POA: Diagnosis not present

## 2022-06-29 DIAGNOSIS — I10 Essential (primary) hypertension: Secondary | ICD-10-CM | POA: Diagnosis not present

## 2022-06-29 DIAGNOSIS — E119 Type 2 diabetes mellitus without complications: Secondary | ICD-10-CM

## 2022-06-29 DIAGNOSIS — R768 Other specified abnormal immunological findings in serum: Secondary | ICD-10-CM | POA: Insufficient documentation

## 2022-06-29 MED ORDER — OZEMPIC (0.25 OR 0.5 MG/DOSE) 2 MG/3ML ~~LOC~~ SOPN: 0.2500 mg | PEN_INJECTOR | SUBCUTANEOUS | 2 refills | Status: DC

## 2022-06-29 NOTE — Progress Notes (Signed)
Established Patient Office Visit  Subjective   Patient ID: [name], female    DOB: 1966-03-14  Age: 56 y.o. MRN: [id number]  Chief Complaint  Patient presents with   Diabetes   Hyperlipidemia    Diabetes: Last A1c 6.7, due to have this checked again today.  On olmesartan, not on statin.  Not currently on any medication for blood sugar.  Had eye exam completed within the last few months.  No personal or family history of thyroid cancer, no personal history of pancreatitis.  Hyperlipidemia: ASCVD risk score greater than 10%.  We have discussed statin therapy in the past but she has not been interested in starting medication thus far.  Bilateral upper extremity weakness: Reports that she has been working longer hours and has been short staffed at work.  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  Denies any sensory changes.  Feels that maybe it is actually coming from her shoulders.  Feels like it is more challenging to grip objects recently.  No recent traumatic accident.  Hypertension: Patient currently on hydrochlorothiazide 25 mg/day, and olmesartan 20 mg/day.  Tolerating medication well.    Review of Systems  Cardiovascular:  Negative for chest pain.  Neurological:  Positive for weakness. Negative for sensory change.      Objective:     BP 120/76   Pulse 91   Temp 98.3 F (36.8 C)   SpO2 96%  BP Readings from Last 3 Encounters:  06/29/22 120/76  03/20/22 114/78  03/16/22 112/80   Wt Readings from Last 3 Encounters:  04/18/22 194 lb (88 kg)  03/20/22 194 lb 6.4 oz (88.2 kg)  03/16/22 192 lb 12.8 oz (87.5 kg)      Physical Exam Vitals reviewed.  Constitutional:      General: She is not in acute distress.    Appearance: Normal appearance.  HENT:     Head: Normocephalic and atraumatic.  Neck:     Vascular: No carotid bruit.  Cardiovascular:     Rate and Rhythm: Normal rate and regular rhythm.     Pulses: Normal  pulses.     Heart sounds: Normal heart sounds.  Pulmonary:     Effort: Pulmonary effort is normal.     Breath sounds: Normal breath sounds.  Skin:    General: Skin is warm and dry.  Neurological:     General: No focal deficit present.     Mental Status: She is alert and oriented to person, place, and time.     Sensory: Sensation is intact.     Motor: Motor function is intact.  Psychiatric:        Mood and Affect: Mood normal.        Behavior: Behavior normal.        Judgment: Judgment normal.      No results found for any visits on 06/29/22.    The 10-year ASCVD risk score (Arnett DK, et al., 2019) is: 17.2%    Assessment & Plan:   Problem List Items Addressed This Visit       Cardiovascular and Mediastinum   Essential hypertension    Chronic, well controlled on current regimen.  Patient to continue hydrochlorothiazide 25 mg/day and olmesartan 20 mg/day.  We will check metabolic panel to monitor kidney function and electrolytes.        Endocrine   Type 2 diabetes mellitus without complication, without long-term current use of insulin ([place]) - Primary  Chronic, pressure decision-making patient agreeable to trial GLP-1.  Samples of Ozempic provided to patient today.  She will to start 0.25 mg per weekly injection.  Patient counseled to notify me if she experiences masses or nodules to thyroid, severe abdominal pain, severe nausea with vomiting, or severe constipation.  Patient reports understanding.  Counseled on importance of proper needle disposal and rotating injection sites.  Patient reports understanding.  Follow-up in 4 to 5 weeks.      Relevant Medications   Semaglutide,0.25 or 0.'5MG'$ /DOS, (OZEMPIC, 0.25 OR 0.5 MG/DOSE,) 2 MG/3ML SOPN   Other Relevant Orders   Hemoglobin Y3K   Basic metabolic panel   Lipid panel     Other   Hyperlipidemia    Order fasting lipid panel to be drawn at later date.  Further recommendations may be made based upon his results.  Did  discuss that I would highly recommend patient consider statin therapy, discuss further at next office visit.      Relevant Orders   Hemoglobin Z6W   Basic metabolic panel   Lipid panel   Peripheral muscle fatigue    No true weakness noted on exam, sensation also appears to be intact.  Wondering if this may be due to over use at work.  Recommend if symptoms do not improve over the next month to possibly consider imaging, patient educated to notify me if she experiences inability to lift arm or move hand, inability to grip objects, inability to feel extremity, etc.  She reports understanding.       Return in about 5 weeks (around 08/03/2022) for f/u for tele visit with Avan Gullett in 4 weeks.    Ailene Ards, NP

## 2022-06-29 NOTE — Assessment & Plan Note (Signed)
No true weakness noted on exam, sensation also appears to be intact.  Wondering if this may be due to over use at work.  Recommend if symptoms do not improve over the next month to possibly consider imaging, patient educated to notify me if she experiences inability to lift arm or move hand, inability to grip objects, inability to feel extremity, etc.  She reports understanding.

## 2022-06-29 NOTE — Assessment & Plan Note (Signed)
Order fasting lipid panel to be drawn at later date.  Further recommendations may be made based upon his results.  Did discuss that I would highly recommend patient consider statin therapy, discuss further at next office visit.

## 2022-06-29 NOTE — Assessment & Plan Note (Signed)
Chronic, pressure decision-making patient agreeable to trial GLP-1.  Samples of Ozempic provided to patient today.  She will to start 0.25 mg per weekly injection.  Patient counseled to notify me if she experiences masses or nodules to thyroid, severe abdominal pain, severe nausea with vomiting, or severe constipation.  Patient reports understanding.  Counseled on importance of proper needle disposal and rotating injection sites.  Patient reports understanding.  Follow-up in 4 to 5 weeks.

## 2022-06-29 NOTE — Assessment & Plan Note (Signed)
Chronic, well controlled on current regimen.  Patient to continue hydrochlorothiazide 25 mg/day and olmesartan 20 mg/day.  We will check metabolic panel to monitor kidney function and electrolytes.

## 2022-07-06 ENCOUNTER — Encounter: Payer: Self-pay | Admitting: Nurse Practitioner

## 2022-08-24 DIAGNOSIS — H8112 Benign paroxysmal vertigo, left ear: Secondary | ICD-10-CM | POA: Diagnosis not present

## 2022-08-30 ENCOUNTER — Encounter: Payer: Self-pay | Admitting: Nurse Practitioner

## 2022-08-30 ENCOUNTER — Ambulatory Visit (INDEPENDENT_AMBULATORY_CARE_PROVIDER_SITE_OTHER): Payer: BC Managed Care – PPO | Admitting: Nurse Practitioner

## 2022-08-30 VITALS — BP 102/78 | HR 107 | Temp 98.3°F | Ht 60.0 in | Wt 184.0 lb

## 2022-08-30 DIAGNOSIS — R42 Dizziness and giddiness: Secondary | ICD-10-CM | POA: Diagnosis not present

## 2022-08-30 MED ORDER — MECLIZINE HCL 12.5 MG PO TABS: 12.5000 mg | ORAL_TABLET | Freq: Three times a day (TID) | ORAL | 0 refills | Status: DC | PRN

## 2022-08-30 NOTE — Progress Notes (Signed)
Established Patient Office Visit  Subjective   Patient ID: [name], female    DOB: 04-Jun-1966  Age: 56 y.o. MRN: [id number]  Chief Complaint  Patient presents with   Dizziness    Vertigo: Patient reports approximately 1 week ago she woke up out of sleep with vertigo-like symptoms.  She reports as long as she would sit up symptoms were not present but when she would lay back symptoms would be triggered.  She was evaluated by provider at her job who showed her Epley maneuvers, which she used as needed as well as an over-the-counter antinausea medicine which resulted in improvement in symptoms.  Reports at that evaluation her A1C and BP were stable. She reports that she has not had any recurrence since then.  She denies any double vision, weakness in her extremities, sensory changes in her extremities, inability to ambulate, ear pain, syncope, or seizure.  She did experience a sensation of ear fullness and 1 episode of tinnitus, tinnitus has since subsided.  Did experience an episode of chronic palpitations which also have subsided.  She has been under extra stress at work.    Review of Systems  HENT:  Positive for tinnitus. Negative for congestion, sinus pain and sore throat.   Eyes:  Negative for double vision.  Respiratory:  Negative for shortness of breath.   Cardiovascular:  Positive for palpitations. Negative for chest pain.  Gastrointestinal:  Positive for nausea and vomiting.  Neurological:  Positive for dizziness and headaches. Negative for sensory change and weakness.      Objective:     BP 102/78 (BP Location: Right Arm, Patient Position: Sitting, Cuff Size: Large)   Pulse (!) 107   Temp 98.3 F (36.8 C) (Oral)   Ht 5' (1.524 m)   Wt 184 lb (83.5 kg)   SpO2 97%   BMI 35.94 kg/m  BP Readings from Last 3 Encounters:  08/30/22 102/78  06/29/22 120/76  03/20/22 114/78   Wt Readings from Last 3 Encounters:  08/30/22 184 lb (83.5 kg)  04/18/22 194 lb (88  kg)  03/20/22 194 lb 6.4 oz (88.2 kg)      Physical Exam Vitals reviewed.  Constitutional:      General: She is not in acute distress.    Appearance: Normal appearance.  HENT:     Head: Normocephalic and atraumatic.  Neck:     Vascular: No carotid bruit.  Cardiovascular:     Rate and Rhythm: Normal rate and regular rhythm.     Pulses: Normal pulses.     Heart sounds: Normal heart sounds.  Pulmonary:     Effort: Pulmonary effort is normal.     Breath sounds: Normal breath sounds.  Skin:    General: Skin is warm and dry.  Neurological:     General: No focal deficit present.     Mental Status: She is alert and oriented to person, place, and time.     Cranial Nerves: Cranial nerves 2-12 are intact.     Sensory: Sensation is intact.     Motor: Motor function is intact.     Coordination: Coordination is intact.     Gait: Gait is intact.  Psychiatric:        Mood and Affect: Mood normal.        Behavior: Behavior normal.        Judgment: Judgment normal.    EKG: Normal sinus rhythm  No results found for any visits on 08/30/22.  The 10-year ASCVD risk score (Arnett DK, et al., 2019) is: 11.5%    Assessment & Plan:   Problem List Items Addressed This Visit       Other   Vertigo - Primary    Intermittent, seems to have resolved.  Sounds consistent with BPPV, would recommend patient continue to monitor and if symptoms reoccur take meclizine as needed.  If symptoms occur again and last longer than a few moments she was instructed to go to the hospital.  Also may consider imaging of the brain if symptoms return.  Will order labs to check stability of kidney function and electrolytes as well as evaluation for possible infection or anemia.  Further recommendations may be made based upon these results.  Patient reports understanding.      Relevant Medications   meclizine (ANTIVERT) 12.5 MG tablet   Other Relevant Orders   EKG 12-Lead   CBC   Basic metabolic panel    Iron   Ferritin    Return in about 3 months (around 11/30/2022) for f/u with Judson Roch.    Ailene Ards, NP

## 2022-08-30 NOTE — Assessment & Plan Note (Signed)
Intermittent, seems to have resolved.  Sounds consistent with BPPV, would recommend patient continue to monitor and if symptoms reoccur take meclizine as needed.  If symptoms occur again and last longer than a few moments she was instructed to go to the hospital.  Also may consider imaging of the brain if symptoms return.  Will order labs to check stability of kidney function and electrolytes as well as evaluation for possible infection or anemia.  Further recommendations may be made based upon these results.  Patient reports understanding.

## 2022-09-04 DIAGNOSIS — H8112 Benign paroxysmal vertigo, left ear: Secondary | ICD-10-CM | POA: Diagnosis not present

## 2022-09-13 ENCOUNTER — Ambulatory Visit: Payer: BC Managed Care – PPO | Admitting: Nurse Practitioner

## 2022-09-19 ENCOUNTER — Ambulatory Visit: Payer: BC Managed Care – PPO | Admitting: Cardiology

## 2022-10-06 ENCOUNTER — Other Ambulatory Visit: Payer: Self-pay | Admitting: Nurse Practitioner

## 2022-10-06 DIAGNOSIS — I10 Essential (primary) hypertension: Secondary | ICD-10-CM

## 2022-10-11 ENCOUNTER — Encounter: Payer: Self-pay | Admitting: Nurse Practitioner

## 2022-10-12 ENCOUNTER — Other Ambulatory Visit: Payer: Self-pay

## 2022-10-12 DIAGNOSIS — E119 Type 2 diabetes mellitus without complications: Secondary | ICD-10-CM

## 2022-10-12 MED ORDER — OZEMPIC (0.25 OR 0.5 MG/DOSE) 2 MG/3ML ~~LOC~~ SOPN: 0.2500 mg | PEN_INJECTOR | SUBCUTANEOUS | 2 refills | Status: DC

## 2022-10-16 MED ORDER — OZEMPIC (0.25 OR 0.5 MG/DOSE) 2 MG/3ML ~~LOC~~ SOPN: 0.2500 mg | PEN_INJECTOR | SUBCUTANEOUS | 2 refills | Status: DC

## 2022-10-16 NOTE — Addendum Note (Signed)
Addended by: Earnstine Regal on: 10/16/2022 02:39 PM   Modules accepted: Orders

## 2022-11-05 ENCOUNTER — Other Ambulatory Visit (HOSPITAL_COMMUNITY): Payer: Self-pay

## 2022-11-05 ENCOUNTER — Telehealth: Payer: Self-pay

## 2022-11-05 NOTE — Telephone Encounter (Signed)
Pharmacy Patient Advocate Encounter   Received notification from [facility] that prior authorization for Ozempic '2mg'$ /82m is required/requested.  Per Test Claim: Prior auth required   PA submitted on 11/05/22 to (ins) [employer] via CoverMyMeds Key [id number]  Prior Authorization for Ozempic has been approved.    PA# [id number] dates: 10/06/22 through 11/05/23

## 2022-11-05 NOTE — Telephone Encounter (Signed)
Placed a call to the pharmacy to notify of the approval.

## 2022-11-09 ENCOUNTER — Ambulatory Visit: Payer: BC Managed Care – PPO | Admitting: Nurse Practitioner

## 2023-01-14 ENCOUNTER — Ambulatory Visit
Admission: EM | Admit: 2023-01-14 | Discharge: 2023-01-14 | Disposition: A | Discharge status: Home / Self Care | Payer: BC Managed Care – PPO | Attending: Urgent Care | Admitting: Urgent Care

## 2023-01-14 DIAGNOSIS — M62830 Muscle spasm of back: Secondary | ICD-10-CM

## 2023-01-14 DIAGNOSIS — E860 Dehydration: Secondary | ICD-10-CM

## 2023-01-14 LAB — POCT URINALYSIS DIP (MANUAL ENTRY)
Bilirubin, UA: NEGATIVE
Glucose, UA: NEGATIVE mg/dL
Ketones, POC UA: NEGATIVE mg/dL
Leukocytes, UA: NEGATIVE
Nitrite, UA: NEGATIVE
Protein Ur, POC: 30 mg/dL — AB
Spec Grav, UA: >=1.03 — AB (ref 1.010–1.025)
Urobilinogen, UA: 0.2 E.U./dL
pH, UA: 5.5 (ref 5.0–8.0)

## 2023-01-14 MED ORDER — ETODOLAC 500 MG PO TABS: 500.0000 mg | ORAL_TABLET | Freq: Two times a day (BID) | ORAL | 0 refills | Status: AC

## 2023-01-14 MED ORDER — TIZANIDINE HCL 4 MG PO TABS: 4.0000 mg | ORAL_TABLET | Freq: Four times a day (QID) | ORAL | 0 refills | Status: DC | PRN

## 2023-01-14 MED ORDER — KETOROLAC TROMETHAMINE 30 MG/ML IJ SOLN
30.0000 mg | Freq: Once | INTRAMUSCULAR | Status: AC
  Administered 2023-01-14: 30 mg via INTRAMUSCULAR

## 2023-01-14 NOTE — ED Triage Notes (Signed)
Pt presents with c/o pain on the right side. Pt states the pain started after injecting ozempic.

## 2023-01-14 NOTE — ED Provider Notes (Signed)
[facility]    CSN: [id number] Arrival date & time: 01/14/23  1629      History   Chief Complaint Chief Complaint  Patient presents with   Flank Pain   Nausea    HPI [name] is a 57 y.o. female.   Pleasant 56 year old female presents today due to concerns of right flank/back pain.  States it started mildly on Thursday but is getting progressively worse.  Originally it started only on the right flank/back, but states it is now radiating to the front as well.  She reports it as a cramp/spasm in nature.  She is currently in a 7 out of 10 pain.  She thought maybe it was gas so took Tums, states she did pass gas after using the Tums, but denies any improvement of the pain.  She does report increased urinary frequency but denies hematuria or fever.  Patient states her normal bowel movement pattern is roughly 3 times a week.  States she had a normal bowel movement today, but otherwise went 1 full week without. States the pain is worse with movements and palpation, better when still. No distention or bloating. Some nausea but no vomiting.   Flank Pain    Past Medical History:  Diagnosis Date   Encounter for screening mammogram for malignant neoplasm of breast 02/02/2022   Essential hypertension    Fibroids    IDA (iron deficiency anemia)    Neck mass    posterior   Scalp mass 02/25/2012    Patient Active Problem List   Diagnosis Date Noted   Vertigo 08/30/2022   ANA positive 06/29/2022   Peripheral muscle fatigue 06/29/2022   Hyperlipidemia 03/16/2022   Obesity 02/23/2022   Other specified abnormal immunological findings in serum 02/23/2022   Exercise intolerance 02/23/2022   Localized swelling of both lower extremities 02/23/2022   Type 2 diabetes mellitus without complication, without long-term current use of insulin 02/23/2022   Stage 3b chronic kidney disease 02/23/2022   Rash 02/02/2022   Plantar fasciitis of left foot 02/08/2017   Essential  hypertension 07/13/2015   Urinary frequency 07/13/2015   Ovarian cyst 07/13/2015   Chlamydia    Fibroids    Scalp mass 02/25/2012    Past Surgical History:  Procedure Laterality Date   cervix cerclage  1987   LIPOSUCTION N/A 08/17/2019   Procedure: LIPOSUCTION POSTERIOR NECK;  Surgeon: Cristine Polio, MD;  Location: [place];  Service: Plastics;  Laterality: N/A;   TOTAL ABDOMINAL HYSTERECTOMY  04/13/2005   TUBAL LIGATION     WISDOM TOOTH EXTRACTION      OB History     Gravida  4   Para  3   Term      Preterm      AB  1   Living  3      SAB  1   IAB      Ectopic      Multiple      Live Births               Home Medications    Prior to Admission medications   Medication Sig Start Date End Date Taking? Authorizing Provider  etodolac (LODINE) 500 MG tablet Take 1 tablet (500 mg total) by mouth 2 (two) times daily with a meal for 5 days. 01/14/23 01/19/23 Yes Meranda Dechaine L, PA  tiZANidine (ZANAFLEX) 4 MG tablet Take 1 tablet (4 mg total) by mouth every 6 (six) hours as needed  for muscle spasms. 01/14/23  Yes Louvina Cleary L, PA  blood glucose meter kit and supplies KIT Dispense based on patient and insurance preference. Check blood sugar every day prior to first meal. 03/16/22   Ailene Ards, NP  cholecalciferol (VITAMIN D3) 25 MCG (1000 UT) tablet Take 1,000 Units by mouth daily.    [provider]  hydrochlorothiazide (HYDRODIURIL) 25 MG tablet Take 1 tablet (25 mg total) by mouth daily. 03/20/22   Freada Bergeron, MD  meclizine (ANTIVERT) 12.5 MG tablet Take 1 tablet (12.5 mg total) by mouth 3 (three) times daily as needed for dizziness. 08/30/22   Ailene Ards, NP  Multiple Vitamins-Minerals (WOMENS MULTIVITAMIN) TABS See admin instructions. 06/09/21   [provider]  olmesartan (BENICAR) 20 MG tablet Take 1 tablet (20 mg total) by mouth daily. 02/08/22   Ailene Ards, NP  Semaglutide,0.25 or 0.5MG /DOS, (OZEMPIC, 0.25  OR 0.5 MG/DOSE,) 2 MG/3ML SOPN Inject 0.25 mg into the skin once a week. 10/16/22   Ailene Ards, NP  triamcinolone cream (KENALOG) 0.1 % Apply 1 application. topically 2 (two) times daily. 02/02/22   Ailene Ards, NP    Family History Family History  Problem Relation Age of Onset   Heart disease Father    Hypertension Father    Stroke Father    Hypertension Mother    Multiple sclerosis Mother    Cancer Maternal Grandmother        ?type   Diabetes Maternal Grandfather    Healthy Paternal Grandmother    Hypertension Brother    Asthma Sister    Emphysema Sister    Lung cancer Sister    Sickle cell trait Daughter     Social History Social History   Tobacco Use   Smoking status: Every Day    Types: E-cigarettes, Cigarettes   Smokeless tobacco: Current  Vaping Use   Vaping Use: Every day  Substance Use Topics   Alcohol use: Yes    Comment: occ   Drug use: No     Allergies   Tylox [oxycodone-acetaminophen] and Tyloxapol   Review of Systems Review of Systems  Genitourinary:  Positive for flank pain.  As per HPI   Physical Exam Triage Vital Signs ED Triage Vitals [01/14/23 1647]  Enc Vitals Group     BP (!) 159/106     Pulse Rate 83     Resp 15     Temp 98.7 F (37.1 C)     Temp Source Oral     SpO2 96 %     Weight      Height      Head Circumference      Peak Flow      Pain Score 4     Pain Loc      Pain Edu?      Excl. in [place]?    No data found.  Updated Vital Signs BP (!) 141/88   Pulse 83   Temp 98.7 F (37.1 C) (Oral)   Resp 15   SpO2 96%   Visual Acuity Right Eye Distance:   Left Eye Distance:   Bilateral Distance:    Right Eye Near:   Left Eye Near:    Bilateral Near:     Physical Exam Vitals and nursing note reviewed.  Constitutional:      General: She is in acute distress (uncomfortable upon inital presentation).     Appearance: Normal appearance. She is normal weight. She is not ill-appearing,  toxic-appearing or diaphoretic.   HENT:     Head: Normocephalic and atraumatic.     Mouth/Throat:     Mouth: Mucous membranes are moist.     Pharynx: Oropharynx is clear. No oropharyngeal exudate or posterior oropharyngeal erythema.  Eyes:     General: No scleral icterus.       Right eye: No discharge.        Left eye: No discharge.     Extraocular Movements: Extraocular movements intact.     Conjunctiva/sclera: Conjunctivae normal.     Pupils: Pupils are equal, round, and reactive to light.  Cardiovascular:     Rate and Rhythm: Normal rate and regular rhythm.  Pulmonary:     Effort: Pulmonary effort is normal.     Breath sounds: Normal breath sounds. No wheezing.  Abdominal:     General: Abdomen is flat. Bowel sounds are normal. There is no distension.     Palpations: Abdomen is soft. There is no mass.     Tenderness: There is no abdominal tenderness. There is no right CVA tenderness, left CVA tenderness, guarding or rebound.     Hernia: No hernia is present.  Musculoskeletal:        General: Tenderness (reproducible tenderness to palpation of the R thoracic region/ flank; tender knotted muscles; no rash or lesions) present.     Cervical back: Normal range of motion and neck supple. No rigidity or tenderness.  Lymphadenopathy:     Cervical: No cervical adenopathy.  Skin:    General: Skin is warm and dry.     Findings: No bruising, erythema or rash.  Neurological:     General: No focal deficit present.     Mental Status: She is alert.      UC Treatments / Results  Labs (all labs ordered are listed, but only abnormal results are displayed) Labs Reviewed  POCT URINALYSIS DIP (MANUAL ENTRY) - Abnormal; Notable for the following components:      Result Value   Spec Grav, UA >=1.030 (*)    Blood, UA trace-intact (*)    Protein Ur, POC =30 (*)    All other components within normal limits    EKG   Radiology No results found.  Procedures Procedures (including critical care time)  Medications  Ordered in UC Medications  ketorolac (TORADOL) 30 MG/ML injection 30 mg (30 mg Intramuscular Given 01/14/23 1740)    Initial Impression / Assessment and Plan / UC Course  I have reviewed the triage vital signs and the nursing notes.  Pertinent labs & imaging results that were available during my care of the patient were reviewed by me and considered in my medical decision making (see chart for details).     Thoracic back pain/spasms -patient was given ketorolac in office.  Her pain level decreased significantly prior to discharge.  Blood pressure also decreased with resolution of pain level.  Suspect her symptoms to be related to muscle spasms at this time.  UA not indicative of infection.  Will recommend patient use muscle relaxer in combination with as needed NSAID. Dehydration -patient admits to excessive caffeine intake, not much water.  Suspect this may be predisposing her to her spasms.  Encouraged patient to increase water intake.  Follow-up with PCP for additional workup as needed.   Final Clinical Impressions(s) / UC Diagnoses   Final diagnoses:  Muscle spasm of back  Dehydration     Discharge Instructions      Your urine sample indicates you are  not drinking enough water.  Please try to cut back on coffee consumption and increase water to eight 16oz bottles daily. Please take the muscle relaxer as prescribed, on an as-needed basis.  Take with caution as it may make you slightly drowsy. Take the anti-inflammatory medication as needed, do not take any additional over the counter NSAIDs such as advil, aleve, motrin, etc Please follow-up with your primary care physician for repeat evaluation and possible labs should your symptoms persist     ED Prescriptions     Medication Sig Dispense Auth. Provider   tiZANidine (ZANAFLEX) 4 MG tablet Take 1 tablet (4 mg total) by mouth every 6 (six) hours as needed for muscle spasms. 30 tablet Nikitta Sobiech L, PA   etodolac (LODINE) 500  MG tablet Take 1 tablet (500 mg total) by mouth 2 (two) times daily with a meal for 5 days. 10 tablet Laasia Arcos L, [place]      PDMP not reviewed this encounter.   Chaney Malling, [place] 01/14/23 2059

## 2023-01-14 NOTE — Discharge Instructions (Signed)
Your urine sample indicates you are not drinking enough water.  Please try to cut back on coffee consumption and increase water to eight 16oz bottles daily. Please take the muscle relaxer as prescribed, on an as-needed basis.  Take with caution as it may make you slightly drowsy. Take the anti-inflammatory medication as needed, do not take any additional over the counter NSAIDs such as advil, aleve, motrin, etc Please follow-up with your primary care physician for repeat evaluation and possible labs should your symptoms persist

## 2023-01-24 ENCOUNTER — Ambulatory Visit (INDEPENDENT_AMBULATORY_CARE_PROVIDER_SITE_OTHER): Payer: BC Managed Care – PPO | Admitting: Nurse Practitioner

## 2023-01-24 VITALS — BP 152/96 | HR 83 | Temp 98.1°F | Ht 60.0 in | Wt 185.0 lb

## 2023-01-24 DIAGNOSIS — I10 Essential (primary) hypertension: Secondary | ICD-10-CM

## 2023-01-24 DIAGNOSIS — R2241 Localized swelling, mass and lump, right lower limb: Secondary | ICD-10-CM | POA: Insufficient documentation

## 2023-01-24 DIAGNOSIS — E785 Hyperlipidemia, unspecified: Secondary | ICD-10-CM | POA: Diagnosis not present

## 2023-01-24 DIAGNOSIS — E119 Type 2 diabetes mellitus without complications: Secondary | ICD-10-CM | POA: Diagnosis not present

## 2023-01-24 LAB — POCT GLYCOSYLATED HEMOGLOBIN (HGB A1C): Hemoglobin A1C: 6 % — AB (ref 4.0–5.6)

## 2023-01-24 MED ORDER — OZEMPIC (0.25 OR 0.5 MG/DOSE) 2 MG/3ML ~~LOC~~ SOPN
0.5000 mg | PEN_INJECTOR | SUBCUTANEOUS | 3 refills | Status: AC
Start: 2023-01-24 — End: ?

## 2023-01-24 MED ORDER — OLMESARTAN MEDOXOMIL 20 MG PO TABS: 20.0000 mg | ORAL_TABLET | Freq: Every day | ORAL | 0 refills | Status: DC

## 2023-01-24 NOTE — Assessment & Plan Note (Signed)
Noted during diabetic foot exam.  Mass appears consistent with lipoma.  Will order ultrasound for further evaluation.

## 2023-01-24 NOTE — Assessment & Plan Note (Signed)
Chronic, check fasting lipid panel today.  Most likely will start patient on atorvastatin if LDL greater than 70.  Discussed adverse effect profile.

## 2023-01-24 NOTE — Assessment & Plan Note (Signed)
Chronic, A1c today 6.0.  Continue ARB.  Will check fasting lipid panel, if LDL greater than 70 we will start patient on atorvastatin.  Patient educated on possible side effects, is agreeable to initiate the medication.  Will increase Ozempic to 0.5 mg/week, patient is notify me if she has significant negative side effects.  Diabetic foot exam today completed, referral to ophthalmology made for diabetic eye exam.

## 2023-01-24 NOTE — Progress Notes (Signed)
Established Patient Office Visit  Subjective   Patient ID: [name], female    DOB: 08/19/1966  Age: 57 y.o. MRN: [id number]  Chief Complaint  Patient presents with   Medical Management of Chronic Issues    Experienced right side pain last week, went to urgent care and a POC UA was done but no infection, pt was prescribed zanaflex 4 mg and etodolac 500 mg, which helped with the pain she was experiencing     Type 2 diabetes for/hyperlipidemia/hypertension: Currently on Ozempic 0.25 mg weekly, on ARB (although out of the prescription currently), not on statin therapy.  Takes hydrochlorothiazide 25 mg daily and normally on olmesartan 20 mg daily, however out of olmesartan currently.  Last A1c 6.7, goal less than 7.  Due for diabetic foot exam, to check urine for microalbuminuria, and diabetic eye exam.  Originally diagnosed in 2021.  Last lipid panel had LDL of 106, ASCVD risk score approximate 20%.    Review of Systems  Constitutional:  Negative for fever, malaise/fatigue and weight loss.  Respiratory:  Negative for shortness of breath.   Cardiovascular:  Negative for chest pain.  Genitourinary:  Negative for dysuria.  Musculoskeletal:  Negative for back pain.      Objective:     BP (!) 152/96   Pulse 83   Temp 98.1 F (36.7 C) (Temporal)   Ht 5' (1.524 m)   Wt 185 lb (83.9 kg)   SpO2 96%   BMI 36.13 kg/m  BP Readings from Last 3 Encounters:  01/24/23 (!) 152/96  01/14/23 (!) 141/88  08/30/22 102/78   Wt Readings from Last 3 Encounters:  01/24/23 185 lb (83.9 kg)  08/30/22 184 lb (83.5 kg)  04/18/22 194 lb (88 kg)      Physical Exam Vitals reviewed.  Constitutional:      General: She is not in acute distress.    Appearance: Normal appearance.  HENT:     Head: Normocephalic and atraumatic.  Neck:     Vascular: No carotid bruit.  Cardiovascular:     Rate and Rhythm: Normal rate and regular rhythm.     Pulses: Normal pulses.     Heart sounds:  Normal heart sounds.  Pulmonary:     Effort: Pulmonary effort is normal.     Breath sounds: Normal breath sounds.  Musculoskeletal:       Feet:  Skin:    General: Skin is warm and dry.  Neurological:     General: No focal deficit present.     Mental Status: She is alert and oriented to person, place, and time.  Psychiatric:        Mood and Affect: Mood normal.        Behavior: Behavior normal.        Judgment: Judgment normal.      Results for orders placed or performed in visit on 01/24/23  POCT HgB A1C  Result Value Ref Range   Hemoglobin A1C 6.0 (A) 4.0 - 5.6 %   HbA1c POC (<> result, manual entry)     HbA1c, POC (prediabetic range)     HbA1c, POC (controlled diabetic range)        The 10-year ASCVD risk score (Arnett DK, et al., 2019) is: 35%    Assessment & Plan:   Problem List Items Addressed This Visit       Cardiovascular and Mediastinum   Essential hypertension    Chronic, above goal today however patient is out of olmesartan.  Previously has been well-controlled on current regimen.  Continue hydrochlorothiazide 25 mg daily, order for metabolic panel signed today, will send in 2-week course of olmesartan with plans to send in additional supply once metabolic panel results have been reviewed.      Relevant Medications   olmesartan (BENICAR) 20 MG tablet   Other Relevant Orders   CBC   Comprehensive metabolic panel   Hemoglobin A1c   Lipid panel   TSH   Microalbumin / creatinine urine ratio   VITAMIN D 25 Hydroxy (Vit-D Deficiency, Fractures)     Endocrine   Type 2 diabetes mellitus without complication, without long-term current use of insulin - Primary    Chronic, A1c today 6.0.  Continue ARB.  Will check fasting lipid panel, if LDL greater than 70 we will start patient on atorvastatin.  Patient educated on possible side effects, is agreeable to initiate the medication.  Will increase Ozempic to 0.5 mg/week, patient is notify me if she has significant  negative side effects.  Diabetic foot exam today completed, referral to ophthalmology made for diabetic eye exam.      Relevant Medications   Semaglutide,0.25 or 0.5MG /DOS, (OZEMPIC, 0.25 OR 0.5 MG/DOSE,) 2 MG/3ML SOPN   olmesartan (BENICAR) 20 MG tablet   Other Relevant Orders   POCT HgB A1C (Completed)   Ambulatory referral to Ophthalmology   CBC   Comprehensive metabolic panel   Hemoglobin A1c   Lipid panel   TSH   Microalbumin / creatinine urine ratio   VITAMIN D 25 Hydroxy (Vit-D Deficiency, Fractures)     Other   Hyperlipidemia    Chronic, check fasting lipid panel today.  Most likely will start patient on atorvastatin if LDL greater than 70.  Discussed adverse effect profile.      Relevant Medications   olmesartan (BENICAR) 20 MG tablet   Other Relevant Orders   CBC   Comprehensive metabolic panel   Hemoglobin A1c   Lipid panel   TSH   Microalbumin / creatinine urine ratio   VITAMIN D 25 Hydroxy (Vit-D Deficiency, Fractures)   Mass of right foot    Noted during diabetic foot exam.  Mass appears consistent with lipoma.  Will order ultrasound for further evaluation.      Relevant Orders   [place] RT LOWER EXTREM LTD SOFT TISSUE NON VASCULAR   CBC   Comprehensive metabolic panel   Hemoglobin A1c   Lipid panel   TSH   Microalbumin / creatinine urine ratio   VITAMIN D 25 Hydroxy (Vit-D Deficiency, Fractures)    Return in about 3 months (around 04/25/2023) for 3-6 months, F/U with Maralyn Sago.    Elenore Paddy, NP

## 2023-01-24 NOTE — Assessment & Plan Note (Signed)
Chronic, above goal today however patient is out of olmesartan.  Previously has been well-controlled on current regimen.  Continue hydrochlorothiazide 25 mg daily, order for metabolic panel signed today, will send in 2-week course of olmesartan with plans to send in additional supply once metabolic panel results have been reviewed.

## 2023-01-25 DIAGNOSIS — E785 Hyperlipidemia, unspecified: Secondary | ICD-10-CM | POA: Diagnosis not present

## 2023-01-27 LAB — LAB REPORT - SCANNED
Albumin, Urine POC: 35.3
Creatinine, POC: 31.3 mg/dL
EGFR: 45
Microalb Creat Ratio: 113

## 2023-01-31 ENCOUNTER — Other Ambulatory Visit: Payer: Self-pay | Admitting: Nurse Practitioner

## 2023-01-31 DIAGNOSIS — I10 Essential (primary) hypertension: Secondary | ICD-10-CM

## 2023-01-31 DIAGNOSIS — E785 Hyperlipidemia, unspecified: Secondary | ICD-10-CM

## 2023-01-31 MED ORDER — OLMESARTAN MEDOXOMIL 20 MG PO TABS: 20.0000 mg | ORAL_TABLET | Freq: Every day | ORAL | 2 refills | Status: DC

## 2023-01-31 MED ORDER — ATORVASTATIN CALCIUM 10 MG PO TABS
10.0000 mg | ORAL_TABLET | Freq: Every day | ORAL | 1 refills | Status: DC
Start: 2023-01-31 — End: 2023-04-25

## 2023-02-15 ENCOUNTER — Ambulatory Visit
Admission: RE | Admit: 2023-02-15 | Discharge: 2023-02-15 | Disposition: A | Discharge status: Home / Self Care | Payer: BC Managed Care – PPO | Source: Ambulatory Visit | Attending: Nurse Practitioner | Admitting: Nurse Practitioner

## 2023-02-15 DIAGNOSIS — R2241 Localized swelling, mass and lump, right lower limb: Secondary | ICD-10-CM | POA: Diagnosis not present

## 2023-03-25 DIAGNOSIS — H4710 Unspecified papilledema: Secondary | ICD-10-CM | POA: Diagnosis not present

## 2023-03-25 DIAGNOSIS — H524 Presbyopia: Secondary | ICD-10-CM | POA: Diagnosis not present

## 2023-03-25 DIAGNOSIS — E119 Type 2 diabetes mellitus without complications: Secondary | ICD-10-CM | POA: Diagnosis not present

## 2023-03-25 DIAGNOSIS — H43393 Other vitreous opacities, bilateral: Secondary | ICD-10-CM | POA: Diagnosis not present

## 2023-03-25 DIAGNOSIS — H25813 Combined forms of age-related cataract, bilateral: Secondary | ICD-10-CM | POA: Diagnosis not present

## 2023-03-25 LAB — HM DIABETES EYE EXAM

## 2023-04-03 ENCOUNTER — Other Ambulatory Visit: Payer: Self-pay | Admitting: Optometry

## 2023-04-03 DIAGNOSIS — H4710 Unspecified papilledema: Secondary | ICD-10-CM

## 2023-04-25 ENCOUNTER — Ambulatory Visit (INDEPENDENT_AMBULATORY_CARE_PROVIDER_SITE_OTHER): Payer: BC Managed Care – PPO | Admitting: Nurse Practitioner

## 2023-04-25 VITALS — BP 114/70 | HR 95 | Temp 97.9°F | Ht 60.0 in | Wt 182.4 lb

## 2023-04-25 DIAGNOSIS — I10 Essential (primary) hypertension: Secondary | ICD-10-CM

## 2023-04-25 DIAGNOSIS — E782 Mixed hyperlipidemia: Secondary | ICD-10-CM | POA: Diagnosis not present

## 2023-04-25 DIAGNOSIS — N1832 Chronic kidney disease, stage 3b: Secondary | ICD-10-CM | POA: Diagnosis not present

## 2023-04-25 NOTE — Progress Notes (Signed)
Established Patient Office Visit  Subjective   Patient ID: [name], female    DOB: [birth year]  Age: 57 y.o. MRN: [id number]  Chief Complaint  Patient presents with   Medical Management of Chronic Issues    Hyperlipidemia/hypertension/type 2 diabetes: Overall patient is feeling well today.  She is on her chronic medications as currently prescribed and tolerating well.  Last lipid panel did identify LDL of 114.  Patient is a current smoker.  Current ASCVD risk were about 16%.  She was recommended to start statin therapy but decided to not start this due to concerns of possible side effects.  Last A1c 6.0.  She is on olmesartan.    Review of Systems  Eyes:  Negative for blurred vision.  Respiratory:  Negative for shortness of breath.   Cardiovascular:  Negative for chest pain and palpitations.  Neurological:  Negative for dizziness and headaches.      Objective:     BP 114/70   Pulse 95   Temp 97.9 F (36.6 C) (Temporal)   Ht 5' (1.524 m)   Wt 182 lb 6 oz (82.7 kg)   SpO2 99%   BMI 35.62 kg/m  BP Readings from Last 3 Encounters:  04/25/23 114/70  01/24/23 (!) 152/96  01/14/23 (!) 141/88   Wt Readings from Last 3 Encounters:  04/25/23 182 lb 6 oz (82.7 kg)  01/24/23 185 lb (83.9 kg)  08/30/22 184 lb (83.5 kg)      Physical Exam Vitals reviewed.  Constitutional:      General: She is not in acute distress.    Appearance: Normal appearance.  HENT:     Head: Normocephalic and atraumatic.  Neck:     Vascular: No carotid bruit.  Cardiovascular:     Rate and Rhythm: Normal rate and regular rhythm.     Pulses: Normal pulses.     Heart sounds: Normal heart sounds.  Pulmonary:     Effort: Pulmonary effort is normal.     Breath sounds: Normal breath sounds.  Skin:    General: Skin is warm and dry.  Neurological:     General: No focal deficit present.     Mental Status: She is alert and oriented to person, place, and time.  Psychiatric:        Mood  and Affect: Mood normal.        Behavior: Behavior normal.        Judgment: Judgment normal.      No results found for any visits on 04/25/23.    The 10-year ASCVD risk score (Arnett DK, et al., 2019) is: 15.9%    Assessment & Plan:   Problem List Items Addressed This Visit       Cardiovascular and Mediastinum   Essential hypertension - Primary    Chronic, stable, blood pressure at goal today Continue hydrochlorothiazide 25 mg by mouth daily, olmesartan 20 mg a mouth daily        Genitourinary   Stage 3b chronic kidney disease (HCC)    Chronic, stable Patient encouraged to follow-up with nephrologist        Other   Hyperlipidemia    Chronic, LDL goal less than 70 For now patient to stay off of pharmacological therapy.  Wants to focus on lifestyle management.  She was encouraged to quit smoking as well as to adhere to a healthy diet and continue to walk or exercise regularly.       Return in about 3 months (around  07/26/2023) for CPE with Maralyn Sago.    Elenore Paddy, NP =

## 2023-04-25 NOTE — Assessment & Plan Note (Signed)
Chronic, stable, blood pressure at goal today Continue hydrochlorothiazide 25 mg by mouth daily, olmesartan 20 mg a mouth daily

## 2023-04-25 NOTE — Assessment & Plan Note (Signed)
Chronic, stable Patient encouraged to follow-up with nephrologist

## 2023-04-25 NOTE — Assessment & Plan Note (Signed)
Chronic, LDL goal less than 70 For now patient to stay off of pharmacological therapy.  Wants to focus on lifestyle management.  She was encouraged to quit smoking as well as to adhere to a healthy diet and continue to walk or exercise regularly.

## 2023-05-15 ENCOUNTER — Other Ambulatory Visit: Payer: BC Managed Care – PPO

## 2023-05-17 ENCOUNTER — Ambulatory Visit
Admission: RE | Admit: 2023-05-17 | Discharge: 2023-05-17 | Disposition: A | Discharge status: Home / Self Care | Payer: BC Managed Care – PPO | Source: Ambulatory Visit | Attending: Optometry | Admitting: Optometry

## 2023-05-17 DIAGNOSIS — H4713 Papilledema associated with retinal disorder: Secondary | ICD-10-CM | POA: Diagnosis not present

## 2023-05-17 DIAGNOSIS — H4710 Unspecified papilledema: Secondary | ICD-10-CM

## 2023-05-17 DIAGNOSIS — R519 Headache, unspecified: Secondary | ICD-10-CM | POA: Diagnosis not present

## 2023-05-17 MED ORDER — GADOPICLENOL 0.5 MMOL/ML IV SOLN
8.0000 mL | Freq: Once | INTRAVENOUS | Status: AC | PRN
  Administered 2023-05-17: 8 mL via INTRAVENOUS

## 2023-05-18 ENCOUNTER — Other Ambulatory Visit: Payer: Self-pay | Admitting: Nurse Practitioner

## 2023-05-18 DIAGNOSIS — E785 Hyperlipidemia, unspecified: Secondary | ICD-10-CM

## 2023-05-21 IMAGING — US US RENAL
1 series · 14 of 25 positions shown · non-contrast
Comparison: CT 06/11/2015

CLINICAL DATA: Stage 3a chronic kidney disease (HCC). Proteinuria,
unspecified type

EXAM:
RENAL / URINARY TRACT ULTRASOUND COMPLETE

[Series 1: us renal · 0.23mm/px · 60 acquisitions, 14 frames shown]
[im 1/60]
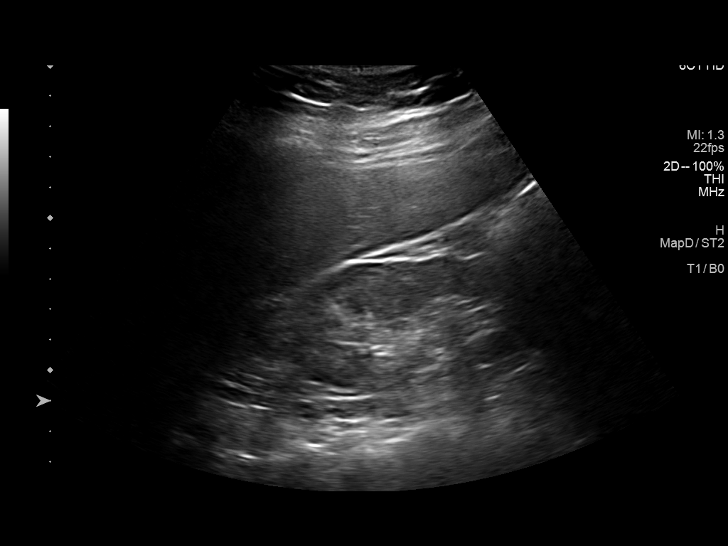
[im 5/60]
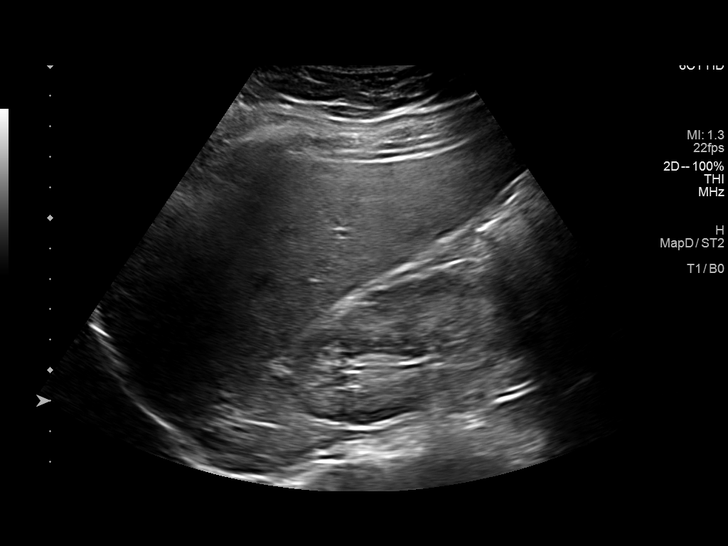
[im 10/60]
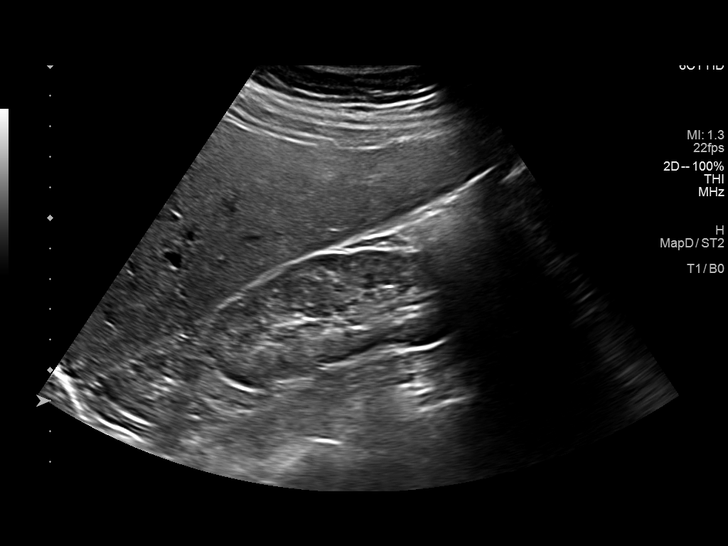
[im 15/60]
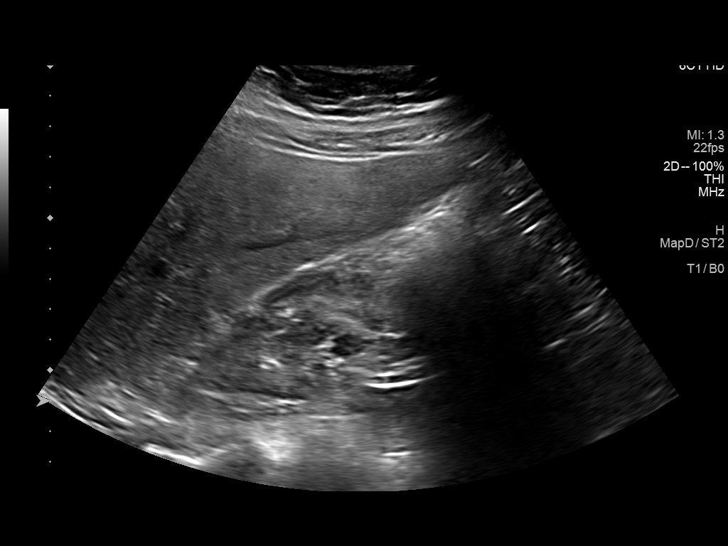
[im 20/60]
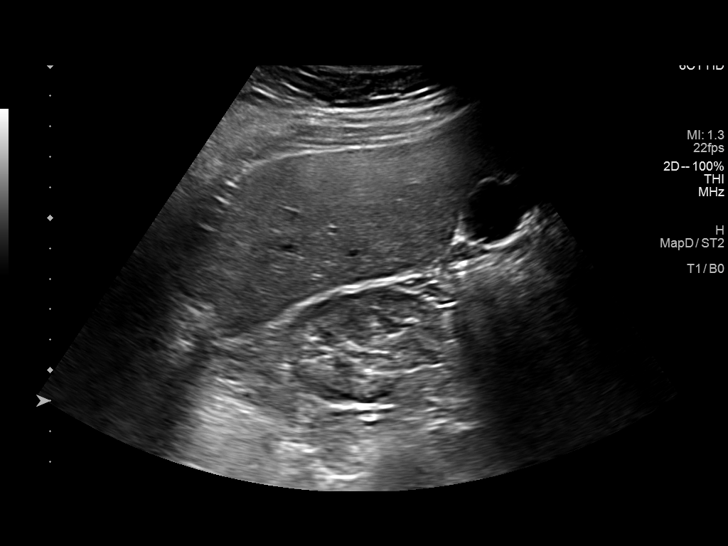
[im 23/60]
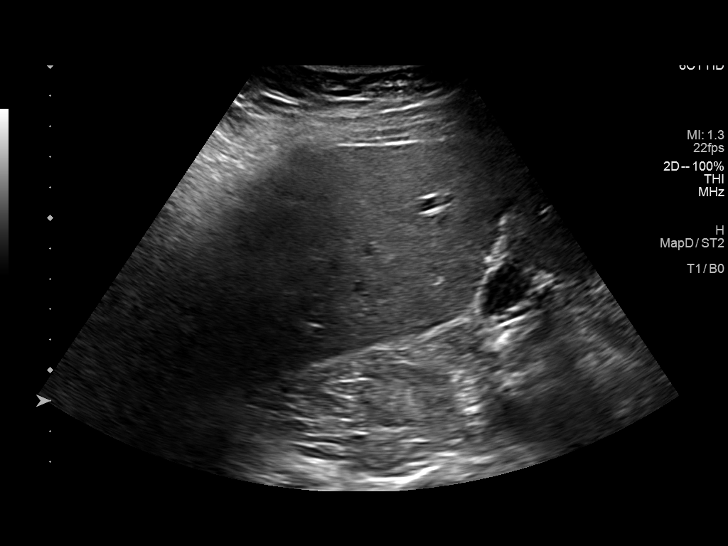
[im 28/60]
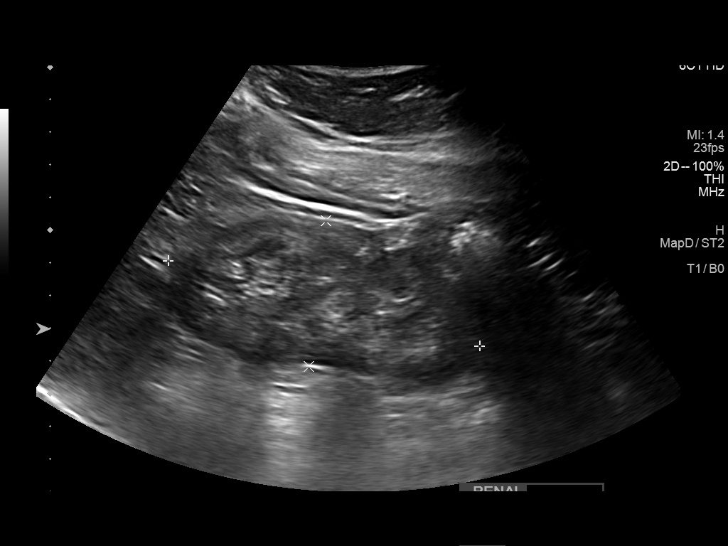
[im 32/60]
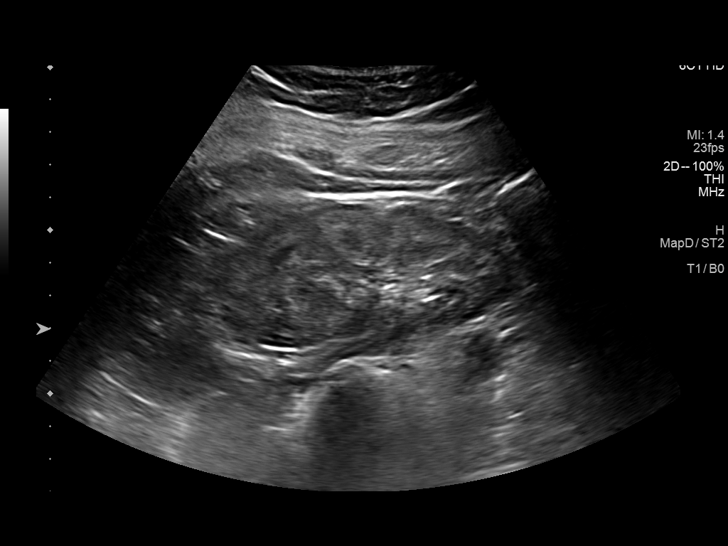
[im 37/60]
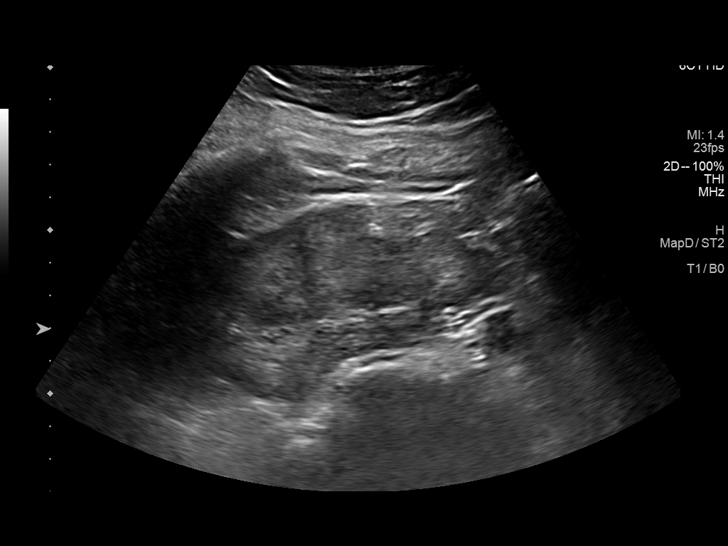
[im 40/60]
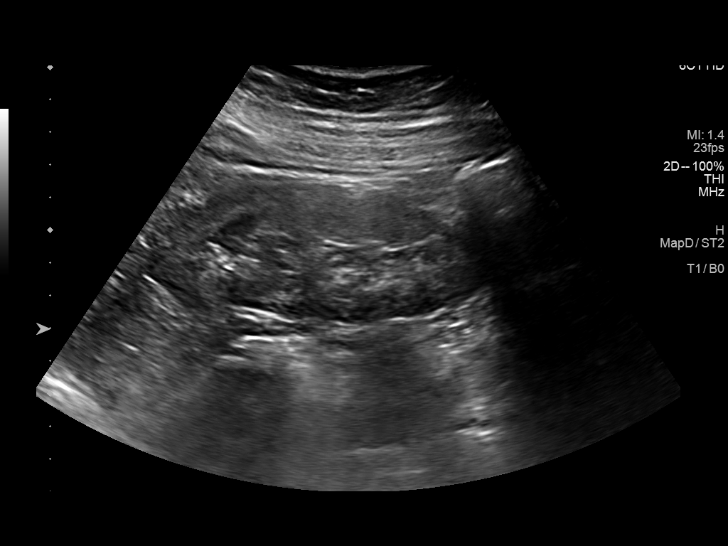
[im 45/60]
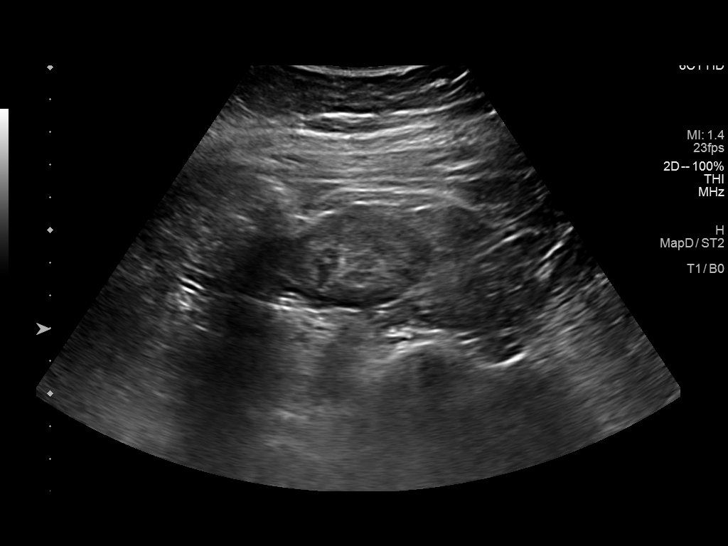
[im 50/60]
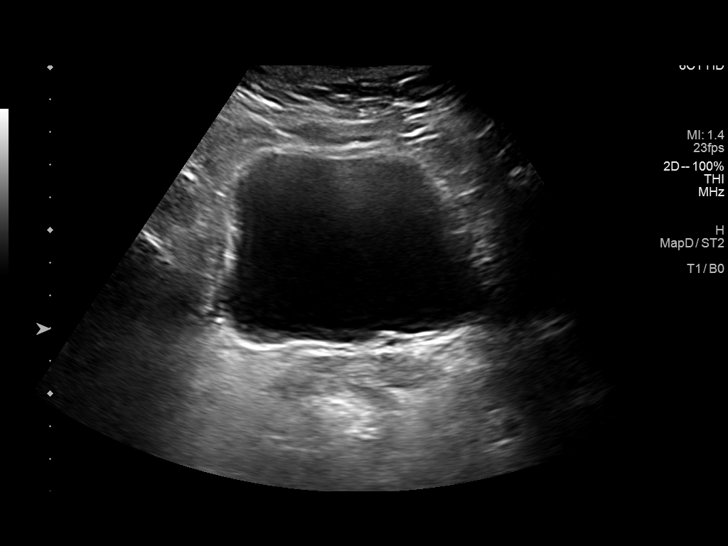
[im 55/60]
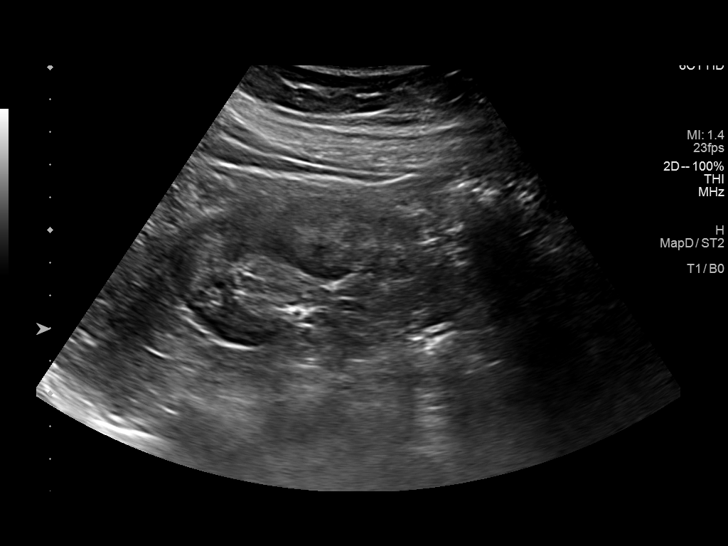
[im 60/60]
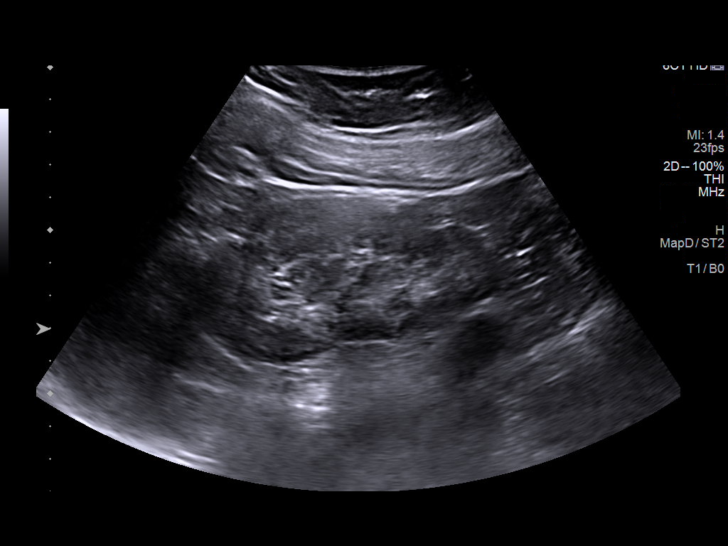

[14 of 25 positions shown; findings below may reference images not displayed]

FINDINGS: Right Kidney:

Renal measurements: 8.8 x 3.7 x 4.7 cm = volume: 80 mL. Increased
renal cortical echogenicity. No mass or hydronephrosis.

Left Kidney:

Renal measurements: 9.9 x 4.5 x 6.7 cm = volume: 154 mL. Increased
renal cortical echogenicity. No hydronephrosis. Masslike area
deforming the renal sinus fat measuring 2.4 x 2.2 x 2.6 cm. This is
similar echogenicity to the adjacent corticomedullary parenchyma.
There is some internal vascularity.

Bladder:

Appears normal for degree of bladder distention.

Other:

None.
IMPRESSION: Increased renal cortical echogenicity bilaterally as can be seen in
chronic kidney disease. No hydronephrosis.

Masslike area deforming the renal sinus fat along the left mid
kidney which may be prominent corticomedullary parenchyma but could
be a renal mass. This is an incompletely evaluated on ultrasound.
Recommend multiphase contrast enhanced CT for further evaluation.

## 2023-05-28 DIAGNOSIS — R92323 Mammographic fibroglandular density, bilateral breasts: Secondary | ICD-10-CM | POA: Diagnosis not present

## 2023-05-28 DIAGNOSIS — Z1231 Encounter for screening mammogram for malignant neoplasm of breast: Secondary | ICD-10-CM | POA: Diagnosis not present

## 2023-05-28 LAB — HM MAMMOGRAPHY

## 2023-05-31 ENCOUNTER — Other Ambulatory Visit: Payer: Self-pay | Admitting: *Deleted

## 2023-05-31 ENCOUNTER — Encounter: Payer: Self-pay | Admitting: Internal Medicine

## 2023-05-31 DIAGNOSIS — R600 Localized edema: Secondary | ICD-10-CM

## 2023-05-31 DIAGNOSIS — R0602 Shortness of breath: Secondary | ICD-10-CM

## 2023-05-31 MED ORDER — HYDROCHLOROTHIAZIDE 25 MG PO TABS
25.0000 mg | ORAL_TABLET | Freq: Every day | ORAL | 0 refills | Status: DC
Start: 2023-05-31 — End: 2023-06-25

## 2023-06-14 ENCOUNTER — Encounter: Payer: Self-pay | Admitting: Optometry

## 2023-06-23 ENCOUNTER — Ambulatory Visit
Admission: RE | Admit: 2023-06-23 | Discharge: 2023-06-23 | Disposition: A | Discharge status: Home / Self Care | Payer: BC Managed Care – PPO | Source: Ambulatory Visit | Attending: Optometry | Admitting: Optometry

## 2023-06-23 DIAGNOSIS — H4710 Unspecified papilledema: Secondary | ICD-10-CM

## 2023-06-24 ENCOUNTER — Other Ambulatory Visit: Payer: Self-pay | Admitting: Cardiology

## 2023-06-24 DIAGNOSIS — R600 Localized edema: Secondary | ICD-10-CM

## 2023-06-24 DIAGNOSIS — R0602 Shortness of breath: Secondary | ICD-10-CM

## 2023-06-25 ENCOUNTER — Other Ambulatory Visit: Payer: Self-pay | Admitting: Cardiology

## 2023-06-25 DIAGNOSIS — R600 Localized edema: Secondary | ICD-10-CM

## 2023-06-25 DIAGNOSIS — R0602 Shortness of breath: Secondary | ICD-10-CM

## 2023-06-26 DIAGNOSIS — H4710 Unspecified papilledema: Secondary | ICD-10-CM | POA: Diagnosis not present

## 2023-07-16 ENCOUNTER — Ambulatory Visit (INDEPENDENT_AMBULATORY_CARE_PROVIDER_SITE_OTHER): Payer: BC Managed Care – PPO | Admitting: Neurology

## 2023-07-16 ENCOUNTER — Encounter: Payer: Self-pay | Admitting: Neurology

## 2023-07-16 ENCOUNTER — Other Ambulatory Visit: Payer: Self-pay | Admitting: Cardiology

## 2023-07-16 VITALS — BP 131/86 | HR 62 | Ht 62.0 in | Wt 185.0 lb

## 2023-07-16 DIAGNOSIS — G932 Benign intracranial hypertension: Secondary | ICD-10-CM | POA: Diagnosis not present

## 2023-07-16 DIAGNOSIS — R600 Localized edema: Secondary | ICD-10-CM

## 2023-07-16 DIAGNOSIS — R0602 Shortness of breath: Secondary | ICD-10-CM

## 2023-07-16 MED ORDER — TOPIRAMATE 100 MG PO TABS: 100.0000 mg | ORAL_TABLET | Freq: Two times a day (BID) | ORAL | 11 refills | Status: DC

## 2023-07-16 NOTE — Progress Notes (Signed)
Chief Complaint  Patient presents with   New Patient (Initial Visit)    Rm 14. Patient alone, Had first part of MRI and CAT scan but is very claustrophobic.       ASSESSMENT AND PLAN  [name] is a 57 y.o. female   Intracranial hypertension Obesity Chronic migraine  Most related to her weight issues  Fluoroscopy guided lumbar puncture  Emphasized importance of weight loss  Topamax 100 mg twice a day  Return To Clinic With NP In 6 Months   DIAGNOSTIC DATA (LABS, IMAGING, TESTING) - I reviewed patient records, labs, notes, testing and imaging myself where available.   MEDICAL HISTORY:  ALFIE ALDERFER, seen in request by   Frazier, [place], OD Elenore Paddy, NP   History is obtained from the patient and review of electronic medical records. I personally reviewed pertinent available imaging films in PACS.   PMHx of  HTN Obesity DM   She had long history of migraine headache, intermittent headache, retro-orbital area, pressure, light noise sensitivity lasting for few hours, she has migraine once or twice each months  She also complains of mild blurry change over the past 1 year, harder to focus, was seen by ophthalmologist Dr. Bascom Levels,  examination showed grade 1 bilateral papillary edema, no hemorrhage,  She was referred for MRI of orbit with without contrast, intraorbital protrusion of optic nerve heads bilaterally, tortuosity of the intraorbital optic nerves, and partially empty sella MRA of the brain showed no significant abnormality  Laboratory evaluations in April 2024, mild elevation of creatinine 1.34, EGFR of 45 LDL of 114, normal TSH, decreased vitamin D level 16   PHYSICAL EXAM:   Vitals:   07/16/23 0917  BP: 131/86  Pulse: 62  Weight: 185 lb (83.9 kg)  Height: 5\' 2"  (1.575 m)   Not recorded     Body mass index is 33.84 kg/m.  PHYSICAL EXAMNIATION:  Gen: NAD, conversant, well nourised, well groomed                      Cardiovascular: Regular rate rhythm, no peripheral edema, warm, nontender. Eyes: Conjunctivae clear without exudates or hemorrhage Neck: Supple, no carotid bruits. Pulmonary: Clear to auscultation bilaterally   NEUROLOGICAL EXAM:  MENTAL STATUS: Speech/cognition: Awake, alert, oriented to history taking and casual conversation CRANIAL NERVES: CN II: Visual fields are full to confrontation. Pupils are round equal and briskly reactive to light. CN III, IV, VI: extraocular movement are normal. No ptosis. CN V: Facial sensation is intact to light touch CN VII: Face is symmetric with normal eye closure  CN VIII: Hearing is normal to causal conversation. CN IX, X: Phonation is normal. CN XI: Head turning and shoulder shrug are intact  MOTOR: There is no pronator drift of out-stretched arms. Muscle bulk and tone are normal. Muscle strength is normal.  REFLEXES: Reflexes are 2+ and symmetric at the biceps, triceps, knees, and ankles. Plantar responses are flexor.  SENSORY: Intact to light touch, pinprick and vibratory sensation are intact in fingers and toes.  COORDINATION: There is no trunk or limb dysmetria noted.  GAIT/STANCE: Posture is normal. Gait is steady with normal steps, base, arm swing, and turning. Heel and toe walking are normal. Tandem gait is normal.  Romberg is absent.  REVIEW OF SYSTEMS:  Full 14 system review of systems performed and notable only for as above All other review of systems were negative.   ALLERGIES: Allergies  Allergen Reactions  Tylox [Oxycodone-Acetaminophen] Itching   Tyloxapol Hives and Other (See Comments)    HOME MEDICATIONS: Current Outpatient Medications  Medication Sig Dispense Refill   blood glucose meter kit and supplies KIT Dispense based on patient and insurance preference. Check blood sugar every day prior to first meal. 1 each 0   cholecalciferol (VITAMIN D3) 25 MCG (1000 UT) tablet Take 1,000 Units by mouth daily.      hydrochlorothiazide (HYDRODIURIL) 25 MG tablet Take 1 tablet (25 mg total) by mouth daily. Please call [phone] to schedule an overdue appointment for future refills. Thank you. Final attempt. 15 tablet 0   Multiple Vitamins-Minerals (WOMENS MULTIVITAMIN) TABS See admin instructions.     olmesartan (BENICAR) 20 MG tablet Take 1 tablet (20 mg total) by mouth daily. 90 tablet 2   Semaglutide,0.25 or 0.5MG /DOS, (OZEMPIC, 0.25 OR 0.5 MG/DOSE,) 2 MG/3ML SOPN Inject 0.5 mg into the skin once a week. 3 mL 3   tiZANidine (ZANAFLEX) 4 MG tablet Take 1 tablet (4 mg total) by mouth every 6 (six) hours as needed for muscle spasms. 30 tablet 0   triamcinolone cream (KENALOG) 0.1 % Apply 1 application. topically 2 (two) times daily. 30 g 0   No current facility-administered medications for this visit.    PAST MEDICAL HISTORY: Past Medical History:  Diagnosis Date   Encounter for screening mammogram for malignant neoplasm of breast 02/02/2022   Essential hypertension    Fibroids    IDA (iron deficiency anemia)    Neck mass    posterior   Scalp mass 02/25/2012    PAST SURGICAL HISTORY: Past Surgical History:  Procedure Laterality Date   cervix cerclage  1987   LIPOSUCTION N/A 08/17/2019   Procedure: LIPOSUCTION POSTERIOR NECK;  Surgeon: Louisa Second, MD;  Location: [facility];  Service: Plastics;  Laterality: N/A;   TOTAL ABDOMINAL HYSTERECTOMY  04/13/2005   TUBAL LIGATION     WISDOM TOOTH EXTRACTION      FAMILY HISTORY: Family History  Problem Relation Age of Onset   Heart disease Father    Hypertension Father    Stroke Father    Hypertension Mother    Multiple sclerosis Mother    Cancer Maternal Grandmother        ?type   Diabetes Maternal Grandfather    Healthy Paternal Grandmother    Hypertension Brother    Asthma Sister    Emphysema Sister    Lung cancer Sister    Sickle cell trait Daughter     SOCIAL HISTORY: Social History   Socioeconomic History    Marital status: Single    Spouse name: Not on file   Number of children: 3   Years of education: 14   Highest education level: Associate degree: occupational, Scientist, product/process development, or vocational program  Occupational History   Occupation: Nurse, adult  Tobacco Use   Smoking status: Every Day    Types: E-cigarettes, Cigarettes   Smokeless tobacco: Current  Vaping Use   Vaping status: Every Day  Substance and Sexual Activity   Alcohol use: Yes    Comment: occ   Drug use: No   Sexual activity: Not Currently    Birth control/protection: Surgical    Comment: 1st intercourse 14 yo-5 partners  Other Topics Concern   Not on file  Social History Narrative   Fun: Family time, movies and relaxing, outdoors   Feels safe at home and denies abuse   Social Determinants of Health   Financial Resource Strain: Low Risk  (01/23/2023)  Overall Financial Resource Strain (CARDIA)    Difficulty of Paying Living Expenses: Not hard at all  Food Insecurity: No Food Insecurity (01/23/2023)   Hunger Vital Sign    Worried About Running Out of Food in the Last Year: Never true    Ran Out of Food in the Last Year: Never true  Transportation Needs: No Transportation Needs (01/23/2023)   PRAPARE - Administrator, Civil Service (Medical): No    Lack of Transportation (Non-Medical): No  Physical Activity: Insufficiently Active (01/23/2023)   Exercise Vital Sign    Days of Exercise per Week: 1 day    Minutes of Exercise per Session: 10 min  Stress: No Stress Concern Present (01/23/2023)   [employer] of Occupational Health - Occupational Stress Questionnaire    Feeling of Stress : Not at all  Social Connections: Unknown (05/17/2023)   Received from [facility]   Social Network    Social Network: Not on file  Intimate Partner Violence: Unknown (05/17/2023)   Received from [facility]   HITS    Physically Hurt: Not on file    Insult or Talk Down To: Not on file    Threaten Physical Harm: Not on  file    Scream or Curse: Not on file      Levert Feinstein, M.D. Ph.D.  [facility] [address] [place], [place] [address] Ph: [phone] Fax: [phone]  CC:  Frazier, [place], OD [address] [place],  [place] [address]  Elenore Paddy, NP

## 2023-07-18 ENCOUNTER — Encounter: Payer: Self-pay | Admitting: Neurology

## 2023-07-18 ENCOUNTER — Telehealth: Payer: Self-pay

## 2023-07-18 ENCOUNTER — Other Ambulatory Visit: Payer: Self-pay

## 2023-07-18 MED ORDER — TOPIRAMATE 25 MG PO TABS: 50.0000 mg | ORAL_TABLET | Freq: Two times a day (BID) | ORAL | 3 refills | Status: DC

## 2023-07-18 NOTE — Telephone Encounter (Signed)
She also reports on taking 1 tablet at bedtime instead of twice daily.

## 2023-07-18 NOTE — Telephone Encounter (Signed)
I failed to reach patient, left message for her to call back, please try to reach patient's again, I have written Topamax 25 mg tablet  She may started at much lower dose, slow titration as her body can tolerate, 25 mg every night to begin with, to see if she can get over the side effect over time,

## 2023-07-18 NOTE — Telephone Encounter (Signed)
Returned call to patient, she reports since starting the topiramate, her vision is not right and she has almost had 2 accidents. She does not like how she is feeling. 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 She is asking if there is something else she can try. Advised I would send to Dr. Terrace Arabia for review and recommendations. Patient appreciative of call.

## 2023-07-18 NOTE — Addendum Note (Signed)
Addended by: Levert Feinstein on: 07/18/2023 12:12 PM   Modules accepted: Orders

## 2023-07-23 ENCOUNTER — Other Ambulatory Visit: Payer: Self-pay | Admitting: Cardiology

## 2023-07-23 DIAGNOSIS — R600 Localized edema: Secondary | ICD-10-CM

## 2023-07-23 DIAGNOSIS — R0602 Shortness of breath: Secondary | ICD-10-CM

## 2023-07-24 ENCOUNTER — Other Ambulatory Visit: Payer: Self-pay

## 2023-07-24 NOTE — Addendum Note (Signed)
Addended by: Levert Feinstein on: 07/24/2023 05:10 PM   Modules accepted: Orders

## 2023-07-25 ENCOUNTER — Encounter (HOSPITAL_BASED_OUTPATIENT_CLINIC_OR_DEPARTMENT_OTHER): Payer: Self-pay | Admitting: Emergency Medicine

## 2023-07-25 ENCOUNTER — Telehealth: Payer: Self-pay | Admitting: *Deleted

## 2023-07-25 DIAGNOSIS — Z79899 Other long term (current) drug therapy: Secondary | ICD-10-CM | POA: Insufficient documentation

## 2023-07-25 DIAGNOSIS — I129 Hypertensive chronic kidney disease with stage 1 through stage 4 chronic kidney disease, or unspecified chronic kidney disease: Secondary | ICD-10-CM | POA: Diagnosis not present

## 2023-07-25 DIAGNOSIS — N189 Chronic kidney disease, unspecified: Secondary | ICD-10-CM | POA: Diagnosis not present

## 2023-07-25 DIAGNOSIS — M549 Dorsalgia, unspecified: Secondary | ICD-10-CM | POA: Insufficient documentation

## 2023-07-25 DIAGNOSIS — R0789 Other chest pain: Secondary | ICD-10-CM | POA: Insufficient documentation

## 2023-07-25 DIAGNOSIS — Z0289 Encounter for other administrative examinations: Secondary | ICD-10-CM

## 2023-07-25 LAB — URINALYSIS, ROUTINE W REFLEX MICROSCOPIC
Bilirubin Urine: NEGATIVE
Glucose, UA: NEGATIVE mg/dL
Hgb urine dipstick: NEGATIVE
Ketones, ur: NEGATIVE mg/dL
Leukocytes,Ua: NEGATIVE
Nitrite: NEGATIVE
Protein, ur: NEGATIVE mg/dL
Specific Gravity, Urine: <1.005 — ABNORMAL LOW (ref 1.005–1.030)
pH: 6 (ref 5.0–8.0)

## 2023-07-25 NOTE — ED Triage Notes (Signed)
Right flank pain. Started Sunday now tonight increased pain. Denies voiding issues.  Started on topramax last Tuesday. Feel "off " on them Dose was already decreased by prescribed

## 2023-07-25 NOTE — Telephone Encounter (Signed)
Pt form in nurse pod.

## 2023-07-26 ENCOUNTER — Emergency Department (HOSPITAL_BASED_OUTPATIENT_CLINIC_OR_DEPARTMENT_OTHER)
Admission: EM | Admit: 2023-07-26 | Discharge: 2023-07-26 | Disposition: A | Discharge status: Home / Self Care | Payer: BC Managed Care – PPO | Attending: Emergency Medicine | Admitting: Emergency Medicine

## 2023-07-26 ENCOUNTER — Emergency Department (HOSPITAL_BASED_OUTPATIENT_CLINIC_OR_DEPARTMENT_OTHER): Payer: BC Managed Care – PPO

## 2023-07-26 DIAGNOSIS — R0789 Other chest pain: Secondary | ICD-10-CM

## 2023-07-26 DIAGNOSIS — R109 Unspecified abdominal pain: Secondary | ICD-10-CM | POA: Diagnosis not present

## 2023-07-26 LAB — CBC WITH DIFFERENTIAL/PLATELET
Abs Immature Granulocytes: 0.02 10*3/uL (ref 0.00–0.07)
Basophils Absolute: 0.1 10*3/uL (ref 0.0–0.1)
Basophils Relative: 1 %
Eosinophils Absolute: 0.1 10*3/uL (ref 0.0–0.5)
Eosinophils Relative: 1 %
HCT: 34.6 % — ABNORMAL LOW (ref 36.0–46.0)
Hemoglobin: 11.7 g/dL — ABNORMAL LOW (ref 12.0–15.0)
Immature Granulocytes: 0 %
Lymphocytes Relative: 34 %
Lymphs Abs: 3.9 10*3/uL (ref 0.7–4.0)
MCH: 28.3 pg (ref 26.0–34.0)
MCHC: 33.8 g/dL (ref 30.0–36.0)
MCV: 83.8 fL (ref 80.0–100.0)
Monocytes Absolute: 0.7 10*3/uL (ref 0.1–1.0)
Monocytes Relative: 7 %
Neutro Abs: 6.6 10*3/uL (ref 1.7–7.7)
Neutrophils Relative %: 57 %
Platelets: 331 10*3/uL (ref 150–400)
RBC: 4.13 MIL/uL (ref 3.87–5.11)
RDW: 13.8 % (ref 11.5–15.5)
WBC: 11.4 10*3/uL — ABNORMAL HIGH (ref 4.0–10.5)
nRBC: 0 % (ref 0.0–0.2)

## 2023-07-26 LAB — BASIC METABOLIC PANEL
Anion gap: 10 (ref 5–15)
BUN: 37 mg/dL — ABNORMAL HIGH (ref 6–20)
CO2: 24 mmol/L (ref 22–32)
Calcium: 9.1 mg/dL (ref 8.9–10.3)
Chloride: 99 mmol/L (ref 98–111)
Creatinine, Ser: 1.73 mg/dL — ABNORMAL HIGH (ref 0.44–1.00)
GFR, Estimated: 34 mL/min — ABNORMAL LOW (ref >60)
Glucose, Bld: 101 mg/dL — ABNORMAL HIGH (ref 70–99)
Potassium: 3.2 mmol/L — ABNORMAL LOW (ref 3.5–5.1)
Sodium: 133 mmol/L — ABNORMAL LOW (ref 135–145)

## 2023-07-26 LAB — TROPONIN I (HIGH SENSITIVITY): Troponin I (High Sensitivity): 3 ng/L

## 2023-07-26 MED ORDER — TRAMADOL HCL 50 MG PO TABS: 50.0000 mg | ORAL_TABLET | Freq: Four times a day (QID) | ORAL | 0 refills | Status: DC | PRN

## 2023-07-26 MED ORDER — SODIUM CHLORIDE 0.9 % IV BOLUS
1000.0000 mL | Freq: Once | INTRAVENOUS | Status: AC
  Administered 2023-07-26: 1000 mL via INTRAVENOUS

## 2023-07-26 MED ORDER — KETOROLAC TROMETHAMINE 30 MG/ML IJ SOLN
30.0000 mg | Freq: Once | INTRAMUSCULAR | Status: AC
  Administered 2023-07-26: 30 mg via INTRAVENOUS
  Filled 2023-07-26: qty 1

## 2023-07-26 MED ORDER — IOHEXOL 350 MG/ML SOLN
100.0000 mL | Freq: Once | INTRAVENOUS | Status: AC | PRN
  Administered 2023-07-26: 75 mL via INTRAVENOUS

## 2023-07-26 NOTE — ED Provider Notes (Signed)
[place] EMERGENCY DEPARTMENT AT [facility] Provider Note   CSN: [id number] Arrival date & time: 07/25/23  2204     History  Chief Complaint  Patient presents with   Medication Reaction   Back Pain    [name] is a 57 y.o. female.  Patient is a 57 year old female with past medical history of hypertension, prediabetes, uterine fibroids, chronic renal insufficiency, hyperlipidemia.  Patient presenting today with complaints of pain to the right side of her back and lower chest.  This has been present for the past 5 days.  This began in the absence of any injury or trauma.  She describes a constant sharp pain to this area that is worse when she moves or changes position.  She denies any fevers or chills.  She denies any productive cough.  Symptoms seem to coincide with her being started on Topamax for what sounds like pseudotumor cerebri.  The history is provided by the patient.       Home Medications Prior to Admission medications   Medication Sig Start Date End Date Taking? Authorizing Provider  blood glucose meter kit and supplies KIT Dispense based on patient and insurance preference. Check blood sugar every day prior to first meal. 03/16/22   Elenore Paddy, NP  cholecalciferol (VITAMIN D3) 25 MCG (1000 UT) tablet Take 1,000 Units by mouth daily.    [provider]  hydrochlorothiazide (HYDRODIURIL) 25 MG tablet Take 1 tablet (25 mg total) by mouth daily. Please call [phone] to schedule an overdue appointment for future refills. Thank you. Final attempt. 07/16/23   Rollene Rotunda, MD  Multiple Vitamins-Minerals (WOMENS MULTIVITAMIN) TABS See admin instructions. 06/09/21   [provider]  olmesartan (BENICAR) 20 MG tablet Take 1 tablet (20 mg total) by mouth daily. 01/31/23   Elenore Paddy, NP  topiramate (TOPAMAX) 100 MG tablet Take 1 tablet (100 mg total) by mouth 2 (two) times daily. 07/16/23   Levert Feinstein, MD  topiramate (TOPAMAX) 25 MG  tablet Take 2 tablets (50 mg total) by mouth 2 (two) times daily. 07/18/23   Levert Feinstein, MD  triamcinolone cream (KENALOG) 0.1 % Apply 1 application. topically 2 (two) times daily. 02/02/22   Elenore Paddy, NP      Allergies    Tylox [oxycodone-acetaminophen] and Tyloxapol    Review of Systems   Review of Systems  All other systems reviewed and are negative.   Physical Exam Updated Vital Signs BP 121/85 (BP Location: Right Arm)   Pulse 77   Temp 99 F (37.2 C) (Oral)   Resp 16   SpO2 98%  Physical Exam Vitals and nursing note reviewed.  Constitutional:      General: She is not in acute distress.    Appearance: She is well-developed. She is not diaphoretic.  HENT:     Head: Normocephalic and atraumatic.  Cardiovascular:     Rate and Rhythm: Normal rate and regular rhythm.     Heart sounds: No murmur heard.    No friction rub. No gallop.  Pulmonary:     Effort: Pulmonary effort is normal. No respiratory distress.     Breath sounds: Normal breath sounds. No wheezing.  Abdominal:     General: Bowel sounds are normal. There is no distension.     Palpations: Abdomen is soft.     Tenderness: There is no abdominal tenderness.  Musculoskeletal:        General: Normal range of motion.     Cervical  back: Normal range of motion and neck supple.  Skin:    General: Skin is warm and dry.  Neurological:     General: No focal deficit present.     Mental Status: She is alert and oriented to person, place, and time.     ED Results / Procedures / Treatments   Labs (all labs ordered are listed, but only abnormal results are displayed) Labs Reviewed  URINALYSIS, ROUTINE W REFLEX MICROSCOPIC - Abnormal; Notable for the following components:      Result Value   Color, Urine COLORLESS (*)    Specific Gravity, Urine <1.005 (*)    All other components within normal limits    EKG EKG Interpretation Date/Time:  Friday July 26 2023 00:44:44 EDT Ventricular Rate:  63 PR  Interval:  189 QRS Duration:  86 QT Interval:  423 QTC Calculation: 433 R Axis:   70  Text Interpretation: Sinus rhythm Normal ECG Confirmed by Geoffery Lyons ([id number]) on 07/26/2023 12:48:11 AM  Radiology No results found.  Procedures Procedures    Medications Ordered in ED Medications  ketorolac (TORADOL) 30 MG/ML injection 30 mg (has no administration in time range)  sodium chloride 0.9 % bolus 1,000 mL (has no administration in time range)    ED Course/ Medical Decision Making/ A&P  Patient presenting with complaints of chest pain and back pain as described in HPI.  Patient arrives with stable vital signs and is afebrile.  Physical examination basically unremarkable.  Workup initiated including CBC, metabolic panel, troponin, and urinalysis.  All test basically unremarkable.  CTA of the chest obtained to rule out pulmonary embolism.  This study was negative.  At this point, patient's workup is basically negative.  Cause of her pain is likely musculoskeletal.  Patient to be discharged with tramadol and follow-up as needed.  Final Clinical Impression(s) / ED Diagnoses Final diagnoses:  None    Rx / DC Orders ED Discharge Orders     None         Geoffery Lyons, MD 07/26/23 [phone]

## 2023-07-26 NOTE — Discharge Instructions (Addendum)
Begin taking tramadol as prescribed as needed for pain.  Follow-up with your primary doctor if symptoms are not improving in the next few days.

## 2023-07-31 ENCOUNTER — Telehealth: Payer: Self-pay | Admitting: *Deleted

## 2023-07-31 NOTE — Telephone Encounter (Signed)
Pt hartford form faxed on 07/31/2023

## 2023-07-31 NOTE — Discharge Instructions (Signed)
Lumbar Puncture Discharge Instructions  Go home and rest quietly as needed. You may resume normal activities; however, do not exert yourself strongly or do any heavy lifting today and tomorrow.   DO NOT drive today.    You may resume your normal diet and medications unless otherwise indicated. Drink lots of extra fluids today and tomorrow.   The incidence of headache, nausea, or vomiting is about 5% (one in 20 patients).  If you develop a headache, lie flat for 24 hours and drink plenty of fluids until the headache goes away.  Caffeinated beverages may be helpful. If when you get up you still have a headache when standing, go back to bed and force fluids for another 24 hours.   If you develop severe nausea and vomiting or a headache that does not go away with the flat bedrest after 48 hours, please call [phone].   Call your physician for a follow-up appointment.  The results of your Lumbar Puncture will be sent directly to your physician and they will contact you.   If you have any questions or if complications develop after you arrive home, please call [phone].  Discharge instructions have been explained to the patient.  The patient, or the person responsible for the patient, fully understands these instructions.   Thank you for visiting our office today.

## 2023-08-01 ENCOUNTER — Inpatient Hospital Stay: Admission: RE | Admit: 2023-08-01 | Payer: BC Managed Care – PPO | Source: Ambulatory Visit

## 2023-08-01 ENCOUNTER — Ambulatory Visit
Admission: RE | Admit: 2023-08-01 | Discharge: 2023-08-01 | Disposition: A | Discharge status: Home / Self Care | Payer: BC Managed Care – PPO | Source: Ambulatory Visit | Attending: Neurology | Admitting: Neurology

## 2023-08-01 VITALS — BP 98/56 | HR 61

## 2023-08-01 DIAGNOSIS — G932 Benign intracranial hypertension: Secondary | ICD-10-CM

## 2023-08-02 ENCOUNTER — Ambulatory Visit: Payer: BC Managed Care – PPO | Admitting: Nurse Practitioner

## 2023-08-03 ENCOUNTER — Encounter (HOSPITAL_BASED_OUTPATIENT_CLINIC_OR_DEPARTMENT_OTHER): Payer: Self-pay | Admitting: Emergency Medicine

## 2023-08-03 ENCOUNTER — Other Ambulatory Visit: Payer: Self-pay

## 2023-08-03 ENCOUNTER — Other Ambulatory Visit (HOSPITAL_BASED_OUTPATIENT_CLINIC_OR_DEPARTMENT_OTHER): Payer: Self-pay

## 2023-08-03 ENCOUNTER — Emergency Department (HOSPITAL_BASED_OUTPATIENT_CLINIC_OR_DEPARTMENT_OTHER)
Admission: EM | Admit: 2023-08-03 | Discharge: 2023-08-03 | Disposition: A | Discharge status: Home / Self Care | Payer: BC Managed Care – PPO | Attending: Emergency Medicine | Admitting: Emergency Medicine

## 2023-08-03 DIAGNOSIS — I10 Essential (primary) hypertension: Secondary | ICD-10-CM | POA: Diagnosis not present

## 2023-08-03 DIAGNOSIS — R519 Headache, unspecified: Secondary | ICD-10-CM | POA: Diagnosis not present

## 2023-08-03 DIAGNOSIS — G971 Other reaction to spinal and lumbar puncture: Secondary | ICD-10-CM | POA: Diagnosis not present

## 2023-08-03 LAB — URINALYSIS, W/ REFLEX TO CULTURE (INFECTION SUSPECTED)
Bacteria, UA: NONE SEEN
Bilirubin Urine: NEGATIVE
Glucose, UA: NEGATIVE mg/dL
Hgb urine dipstick: NEGATIVE
Ketones, ur: NEGATIVE mg/dL
Leukocytes,Ua: NEGATIVE
Nitrite: NEGATIVE
Protein, ur: NEGATIVE mg/dL
Specific Gravity, Urine: <1.005 — ABNORMAL LOW (ref 1.005–1.030)
pH: 6.5 (ref 5.0–8.0)

## 2023-08-03 MED ORDER — PROCHLORPERAZINE EDISYLATE 10 MG/2ML IJ SOLN
10.0000 mg | Freq: Once | INTRAMUSCULAR | Status: AC
  Administered 2023-08-03: 10 mg via INTRAVENOUS
  Filled 2023-08-03: qty 2

## 2023-08-03 MED ORDER — LACTATED RINGERS IV BOLUS
500.0000 mL | Freq: Once | INTRAVENOUS | Status: AC
  Administered 2023-08-03: 500 mL via INTRAVENOUS

## 2023-08-03 MED ORDER — BUTALBITAL-APAP-CAFFEINE 50-325-40 MG PO TABS
1.0000 | ORAL_TABLET | Freq: Once | ORAL | Status: DC
Start: 2023-08-03 — End: 2023-08-03

## 2023-08-03 MED ORDER — PROCHLORPERAZINE MALEATE 10 MG PO TABS: 10.0000 mg | ORAL_TABLET | Freq: Two times a day (BID) | ORAL | 0 refills | Status: AC | PRN

## 2023-08-03 MED ORDER — DIPHENHYDRAMINE HCL 50 MG/ML IJ SOLN
25.0000 mg | Freq: Once | INTRAMUSCULAR | Status: AC
  Administered 2023-08-03: 25 mg via INTRAVENOUS
  Filled 2023-08-03: qty 1

## 2023-08-03 NOTE — ED Provider Notes (Signed)
[place] [facility] AT [facility] Provider Note   CSN: [id number] Arrival date & time: 08/03/23  4098     History {Add pertinent medical, surgical, social history, OB history to HPI:1} Chief Complaint  Patient presents with   Headache    [name] is a 57 y.o. female.  HPI      Thank 57 year old female with a history of hypertension, prediabetes, fibroids, chronic renal insufficiency, hyperlipidemia, intracranial hypertension with fluoroscopically guided LP with opening pressure 17 on 10/17 who presents with headache.  Initially felt better after LP, then took a shower, got out and laid down, but when got back up felt a little headache, took ibuprofen, laid back down, still didn't go away, started feeling pain through neck and lower abdominal pain, then turned on side and felt pressure in head.  They recommended laying flat but it didn't help, tried drinking a lot of water,    Past Medical History:  Diagnosis Date   Encounter for screening mammogram for malignant neoplasm of breast 02/02/2022   Essential hypertension    Fibroids    IDA (iron deficiency anemia)    Neck mass    posterior   Scalp mass 02/25/2012     Home Medications Prior to Admission medications   Medication Sig Start Date End Date Taking? Authorizing Provider  blood glucose meter kit and supplies KIT Dispense based on patient and insurance preference. Check blood sugar every day prior to first meal. 03/16/22   Elenore Paddy, NP  cholecalciferol (VITAMIN D3) 25 MCG (1000 UT) tablet Take 1,000 Units by mouth daily.    [provider]  hydrochlorothiazide (HYDRODIURIL) 25 MG tablet Take 1 tablet (25 mg total) by mouth daily. Please call [phone] to schedule an overdue appointment for future refills. Thank you. Final attempt. 07/16/23   Rollene Rotunda, MD  Multiple Vitamins-Minerals (WOMENS MULTIVITAMIN) TABS See admin instructions. 06/09/21   [provider]   olmesartan (BENICAR) 20 MG tablet Take 1 tablet (20 mg total) by mouth daily. 01/31/23   Elenore Paddy, NP  topiramate (TOPAMAX) 100 MG tablet Take 1 tablet (100 mg total) by mouth 2 (two) times daily. 07/16/23   Levert Feinstein, MD  topiramate (TOPAMAX) 25 MG tablet Take 2 tablets (50 mg total) by mouth 2 (two) times daily. 07/18/23   Levert Feinstein, MD  traMADol (ULTRAM) 50 MG tablet Take 1 tablet (50 mg total) by mouth every 6 (six) hours as needed. 07/26/23   Geoffery Lyons, MD  triamcinolone cream (KENALOG) 0.1 % Apply 1 application. topically 2 (two) times daily. 02/02/22   Elenore Paddy, NP      Allergies    Tylox [oxycodone-acetaminophen] and Tyloxapol    Review of Systems   Review of Systems  Physical Exam Updated Vital Signs BP 127/70   Pulse (!) 55   Temp 98.5 F (36.9 C) (Oral)   Resp 18   Ht 5\' 2"  (1.575 m)   Wt 83.9 kg   SpO2 99%   BMI 33.84 kg/m  Physical Exam  ED Results / Procedures / Treatments   Labs (all labs ordered are listed, but only abnormal results are displayed) Labs Reviewed - No data to display  EKG None  Radiology DG FL GUIDED LUMBAR PUNCTURE  Result Date: 08/01/2023 CLINICAL DATA:  Benign intracranial hypertension. EXAM: DIAGNOSTIC LUMBAR PUNCTURE UNDER FLUOROSCOPIC GUIDANCE COMPARISON:  None Available. FLUOROSCOPY: Radiation Exposure Index (as provided by the fluoroscopic device): 1.0 mGy Kerma PROCEDURE: Informed consent was obtained  from the patient prior to the procedure, including potential complications of headache, allergy, and pain. With the patient prone, the lower back was prepped with Betadine. 1% lidocaine was used for local anesthesia. Lumbar puncture was performed at the L5-S1 level via a right interlaminar approach using a 6 inch 20 gauge needle with return of clear, colorless CSF with an opening pressure of 17 cm water (measured in the left lateral decubitus position). 12 mL of CSF were obtained for laboratory studies. Closing pressure was 8  cm water. The patient tolerated the procedure well, and there were no apparent complications. IMPRESSION: Successful fluoroscopically-guided lumbar puncture. Opening pressure of 17 cm water. Electronically Signed   By: Sebastian Ache M.D.   On: 08/01/2023 11:40    Procedures Procedures  {Document cardiac monitor, telemetry assessment procedure when appropriate:1}  Medications Ordered in ED Medications - No data to display  ED Course/ Medical Decision Making/ A&P   {   Click here for ABCD2, HEART and other calculatorsREFRESH Note before signing :1}                              Medical Decision Making Risk Prescription drug management.   ***  {Document critical care time when appropriate:1} {Document review of labs and clinical decision tools ie heart score, Chads2Vasc2 etc:1}  {Document your independent review of radiology images, and any outside records:1} {Document your discussion with family members, caretakers, and with consultants:1} {Document social determinants of health affecting pt's care:1} {Document your decision making why or why not admission, treatments were needed:1} Final Clinical Impression(s) / ED Diagnoses Final diagnoses:  None    Rx / DC Orders ED Discharge Orders     None

## 2023-08-03 NOTE — ED Notes (Signed)
Pt able to drink 480 cc coke earlier. Dc instructions reviewed with patient. Patient voiced understanding. Dc with belongings.

## 2023-08-03 NOTE — ED Triage Notes (Signed)
  Patient comes in with severe headache that has been going on since 1800 last night.  Patient states she had lumbar puncture on Thursday for chronic headaches and was told her CSF pressure was high.  Patient has been laying down to help with pain but unable to get comfortable.  Pain 9/10, aching.

## 2023-08-03 NOTE — ED Notes (Signed)
Pt assisted to bathroom ,wheelchair needed due to severe headache, weakness

## 2023-08-05 ENCOUNTER — Telehealth: Payer: Self-pay | Admitting: Neurology

## 2023-08-05 NOTE — Telephone Encounter (Signed)
Pt called to check on result of spinal tab results

## 2023-08-27 ENCOUNTER — Other Ambulatory Visit: Payer: Self-pay | Admitting: Cardiology

## 2023-08-27 ENCOUNTER — Other Ambulatory Visit: Payer: Self-pay | Admitting: Nurse Practitioner

## 2023-08-27 DIAGNOSIS — R0602 Shortness of breath: Secondary | ICD-10-CM

## 2023-08-27 DIAGNOSIS — R600 Localized edema: Secondary | ICD-10-CM

## 2023-08-27 DIAGNOSIS — E785 Hyperlipidemia, unspecified: Secondary | ICD-10-CM

## 2023-08-30 LAB — CSF CELL COUNT WITH DIFFERENTIAL
RBC Count, CSF: 7 {cells}/uL — ABNORMAL HIGH
TOTAL NUCLEATED CELL: 2 {cells}/uL (ref 0–5)

## 2023-08-30 LAB — FUNGUS CULTURE W SMEAR
CULTURE:: NO GROWTH
MICRO NUMBER:: 15607950
SMEAR:: NONE SEEN
SPECIMEN QUALITY:: ADEQUATE

## 2023-08-30 LAB — GRAM STAIN
MICRO NUMBER:: 15607949
SPECIMEN QUALITY:: ADEQUATE

## 2023-08-30 LAB — PROTEIN, CSF: Total Protein, CSF: 53 mg/dL — ABNORMAL HIGH (ref 15–45)

## 2023-08-30 LAB — VDRL, CSF: VDRL Quant, CSF: NONREACTIVE

## 2023-08-30 LAB — GLUCOSE, CSF: Glucose, CSF: 69 mg/dL (ref 40–80)

## 2023-09-04 ENCOUNTER — Other Ambulatory Visit: Payer: Self-pay | Admitting: Cardiology

## 2023-09-04 DIAGNOSIS — R0602 Shortness of breath: Secondary | ICD-10-CM

## 2023-09-04 DIAGNOSIS — R600 Localized edema: Secondary | ICD-10-CM

## 2023-09-18 ENCOUNTER — Encounter: Payer: Self-pay | Admitting: Nurse Practitioner

## 2023-09-18 ENCOUNTER — Ambulatory Visit (INDEPENDENT_AMBULATORY_CARE_PROVIDER_SITE_OTHER): Payer: BC Managed Care – PPO | Admitting: Nurse Practitioner

## 2023-09-18 VITALS — BP 124/80 | HR 94 | Temp 98.5°F | Ht 62.0 in | Wt 176.0 lb

## 2023-09-18 DIAGNOSIS — E119 Type 2 diabetes mellitus without complications: Secondary | ICD-10-CM

## 2023-09-18 DIAGNOSIS — I10 Essential (primary) hypertension: Secondary | ICD-10-CM

## 2023-09-18 DIAGNOSIS — E782 Mixed hyperlipidemia: Secondary | ICD-10-CM

## 2023-09-18 NOTE — Progress Notes (Signed)
Established Patient Office Visit  Subjective   Patient ID: [name], female    DOB: Oct 01, 1966  Age: 57 y.o. MRN: [id number]  Chief Complaint  Patient presents with   Medication Refill    Hypertension: Patient reports has been out of her hydrochlorothiazide for about 3 days.  Continues on olmesartan 20 mg daily.  Tolerating well.  Last metabolic panel identified hypokalemia with potassium level of 3.2 and elevated creatinine of 1.73 with GFR of 34.  This was collected about 6 weeks ago, does not appear that repeat testing was completed.  Since she was seen last evidence of papilledema was noted by ophthalmologist.  She was then referred to neurology who performed spinal tap and ruled out intracranial hypertension.  Patient reports vision is stable and she plans on following up with her ophthalmologist next week.  She does have history of headaches, but overall denies frequent or worsened headaches. Currently on Topamax, reports that she is not tolerating the medication very well.  Not sure if she should continue taking this medication.    Review of Systems  Respiratory:  Negative for shortness of breath.   Cardiovascular:  Negative for chest pain.      Objective:     BP 124/80 (BP Location: Left Arm, Patient Position: Sitting, Cuff Size: Normal)   Pulse 94   Temp 98.5 F (36.9 C) (Oral)   Ht 5\' 2"  (1.575 m)   Wt 176 lb (79.8 kg)   SpO2 97%   BMI 32.19 kg/m  BP Readings from Last 3 Encounters:  09/18/23 124/80  08/03/23 116/89  08/01/23 (!) 98/56   Wt Readings from Last 3 Encounters:  09/18/23 176 lb (79.8 kg)  08/03/23 185 lb (83.9 kg)  07/16/23 185 lb (83.9 kg)      Physical Exam Vitals reviewed.  Constitutional:      General: She is not in acute distress.    Appearance: Normal appearance.  HENT:     Head: Normocephalic and atraumatic.  Neck:     Vascular: No carotid bruit.  Cardiovascular:     Rate and Rhythm: Normal rate and regular rhythm.      Pulses: Normal pulses.     Heart sounds: Normal heart sounds.  Pulmonary:     Effort: Pulmonary effort is normal.     Breath sounds: Normal breath sounds.  Skin:    General: Skin is warm and dry.  Neurological:     General: No focal deficit present.     Mental Status: She is alert and oriented to person, place, and time.  Psychiatric:        Mood and Affect: Mood normal.        Behavior: Behavior normal.        Judgment: Judgment normal.      No results found for any visits on 09/18/23.    The 10-year ASCVD risk score (Arnett DK, et al., 2019) is: 21.6%    Assessment & Plan:   Problem List Items Addressed This Visit       Cardiovascular and Mediastinum   Essential hypertension - Primary    Chronic Stable on olmesartan as monotherapy. Orders for repeat CMP, A1c, lipid panel is these are all due signed today.  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  If you are unable to get these labs drawn patient will have to come to our lab to have this  drawn. Follow-up in 6 weeks for close monitoring of blood pressure.      Relevant Orders   Comprehensive metabolic panel     Endocrine   Type 2 diabetes mellitus without complication, without long-term current use of insulin (HCC)    Labs ordered, further recommendations may be made based upon these results       Relevant Orders   Hemoglobin A1c     Other   Hyperlipidemia    Labs ordered, further recommendations may be made based upon his results       Relevant Orders   Lipid panel   **We will plan on discussing patient and regarding her papilledema with supervising physician at next meeting.  Return in about 8 weeks (around 11/13/2023) for F/U with Maralyn Sago.    Elenore Paddy, NP

## 2023-09-18 NOTE — Assessment & Plan Note (Signed)
Labs ordered, further recommendations may be made based upon these results. 

## 2023-09-18 NOTE — Assessment & Plan Note (Signed)
Chronic Stable on olmesartan as monotherapy. Orders for repeat CMP, A1c, lipid panel is these are all due signed today.  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  If you are unable to get these labs drawn patient will have to come to our lab to have this drawn. Follow-up in 6 weeks for close monitoring of blood pressure.

## 2023-09-18 NOTE — Assessment & Plan Note (Signed)
Labs ordered, further recommendations may be made based upon his results. 

## 2023-09-18 NOTE — Patient Instructions (Signed)
Monitor BP at home. If BP is >140/>85 send my a [employer].

## 2023-09-20 ENCOUNTER — Ambulatory Visit: Payer: BC Managed Care – PPO | Admitting: Nurse Practitioner

## 2023-09-20 DIAGNOSIS — E782 Mixed hyperlipidemia: Secondary | ICD-10-CM | POA: Diagnosis not present

## 2023-09-25 DIAGNOSIS — H4710 Unspecified papilledema: Secondary | ICD-10-CM | POA: Diagnosis not present

## 2023-09-26 ENCOUNTER — Ambulatory Visit: Payer: BC Managed Care – PPO | Admitting: Nurse Practitioner

## 2023-11-13 ENCOUNTER — Ambulatory Visit (INDEPENDENT_AMBULATORY_CARE_PROVIDER_SITE_OTHER): Payer: BC Managed Care – PPO | Admitting: Nurse Practitioner

## 2023-11-13 VITALS — BP 142/94 | HR 85 | Temp 98.0°F | Ht 62.0 in | Wt 178.2 lb

## 2023-11-13 DIAGNOSIS — I10 Essential (primary) hypertension: Secondary | ICD-10-CM

## 2023-11-13 DIAGNOSIS — E782 Mixed hyperlipidemia: Secondary | ICD-10-CM

## 2023-11-13 DIAGNOSIS — E119 Type 2 diabetes mellitus without complications: Secondary | ICD-10-CM

## 2023-11-13 MED ORDER — ROSUVASTATIN CALCIUM 10 MG PO TABS: 10.0000 mg | ORAL_TABLET | Freq: Every day | ORAL | 0 refills | Status: DC

## 2023-11-13 MED ORDER — OLMESARTAN MEDOXOMIL 40 MG PO TABS: 40.0000 mg | ORAL_TABLET | Freq: Every day | ORAL | 1 refills | Status: DC

## 2023-11-13 NOTE — Assessment & Plan Note (Addendum)
Chronic Blood pressure above goal on olmesartan 20 mg/day.  Repeat metabolic panel shows improved electrolytes and kidney function.  Will increase olmesartan to 40 mg/day, patient to follow-up in 4 weeks. Patient provided with printout for lab order to have BMP rechecked in 7 to 10 days to monitor for kidney function stability and electrolyte levels.

## 2023-11-13 NOTE — Progress Notes (Addendum)
Established Patient Office Visit  Subjective   Patient ID: [name], female    DOB: 1966/09/09  Age: 58 y.o. MRN: [id number]  Chief Complaint  Patient presents with   Hypertension   T2DM/HTN/HLD: labs (CMP, lipid, A1C) ordered 09/2023 have not yet been returned to me from last visit. Patient reports that she had her blood work drawn, but there is no evidence in EMR that results have been sent back to [place]. She continues on olmesartan 20mg /day (d/c'd hydrochlorothiazide due to electrolyte abnormalities and worsened kidney function in 07/2023). Diabetes is diet controlled with last A1C 6.0 01/2023. Last LDL 106 from 01/2022, not currently on cholesterol lowering medication. Overall is feeling well today.    Patient was able to look up her [facility] portal to show me lab results from 09/21/23. They are as follows: CMP: Glucose 88 BUN 11 Creatinine 1.11 eGFR 57 Sodium 137 Potassium 4.5 Chloride 102 CO2 21 Calcium 9.0 Total protein 6.8 Albumin 4.3 Total globulin 2.5 Total bilirubin 0.5 Alkaline phosphatase 97 AST 20 ALT 26  Lipid: Total cholesterol 179 Triglycerides 55 HDL 57 VLDL 11 LDL 111  A1c: 6.4     Review of Systems  Eyes:  Negative for blurred vision.  Respiratory:  Negative for shortness of breath.   Cardiovascular:  Negative for chest pain.  Neurological:  Negative for headaches.      Objective:     BP (!) 142/94   Pulse 85   Temp 98 F (36.7 C) (Temporal)   Ht 5\' 2"  (1.575 m)   Wt 178 lb 4 oz (80.9 kg)   SpO2 98%   BMI 32.60 kg/m  BP Readings from Last 3 Encounters:  11/13/23 (!) 142/94  09/18/23 124/80  08/03/23 116/89   Wt Readings from Last 3 Encounters:  11/13/23 178 lb 4 oz (80.9 kg)  09/18/23 176 lb (79.8 kg)  08/03/23 185 lb (83.9 kg)      Physical Exam Vitals reviewed.  Constitutional:      General: She is not in acute distress.    Appearance: Normal appearance.  HENT:     Head: Normocephalic and atraumatic.   Neck:     Vascular: No carotid bruit.  Cardiovascular:     Rate and Rhythm: Normal rate and regular rhythm.     Pulses: Normal pulses.     Heart sounds: Normal heart sounds.  Pulmonary:     Effort: Pulmonary effort is normal.     Breath sounds: Normal breath sounds.  Skin:    General: Skin is warm and dry.  Neurological:     General: No focal deficit present.     Mental Status: She is alert and oriented to person, place, and time.  Psychiatric:        Mood and Affect: Mood normal.        Behavior: Behavior normal.        Judgment: Judgment normal.      No results found for any visits on 11/13/23.    The 10-year ASCVD risk score (Arnett DK, et al., 2019) is: 30.8%    Assessment & Plan:   Problem List Items Addressed This Visit       Cardiovascular and Mediastinum   Essential hypertension - Primary   Chronic Blood pressure above goal on olmesartan 20 mg/day.  Repeat metabolic panel shows improved electrolytes and kidney function.  Will increase olmesartan to 40 mg/day, patient to follow-up in 4 weeks. Patient provided with printout for lab order to have  BMP rechecked in 7 to 10 days to monitor for kidney function stability and electrolyte levels.      Relevant Medications   rosuvastatin (CRESTOR) 10 MG tablet   olmesartan (BENICAR) 40 MG tablet   Other Relevant Orders   Basic metabolic panel     Endocrine   Type 2 diabetes mellitus without complication, without long-term current use of insulin (HCC)   Chronic, stable Per patient preference she would like to continue to be diet controlled. A1c 6.4 which is at goal so I am agreeable to this. Continue olmesartan and because LDL is greater than 70 she is agreeable to try rosuvastatin.  Rosuvastatin 10 mg a mouth daily prescription sent to pharmacy.      Relevant Medications   rosuvastatin (CRESTOR) 10 MG tablet   olmesartan (BENICAR) 40 MG tablet     Other   Hyperlipidemia   Chronic LDL above goal at 111,  goal is less than 70 Patient is agreeable to trial rosuvastatin.  Rosuvastatin 10 mg tablet sent to pharmacy for patient to take.  1 tablet by mouth daily.      Relevant Medications   rosuvastatin (CRESTOR) 10 MG tablet   olmesartan (BENICAR) 40 MG tablet   Other Relevant Orders   Basic metabolic panel    Return in about 6 weeks (around 12/25/2023) for 4-6 weeks F/U with Maralyn Sago.    Elenore Paddy, NP

## 2023-11-13 NOTE — Assessment & Plan Note (Signed)
Chronic, stable Per patient preference she would like to continue to be diet controlled. A1c 6.4 which is at goal so I am agreeable to this. Continue olmesartan and because LDL is greater than 70 she is agreeable to try rosuvastatin.  Rosuvastatin 10 mg a mouth daily prescription sent to pharmacy.

## 2023-11-13 NOTE — Assessment & Plan Note (Signed)
Chronic LDL above goal at 111, goal is less than 70 Patient is agreeable to trial rosuvastatin.  Rosuvastatin 10 mg tablet sent to pharmacy for patient to take.  1 tablet by mouth daily.

## 2023-11-27 ENCOUNTER — Encounter: Payer: Self-pay | Admitting: Nurse Practitioner

## 2023-12-03 LAB — BASIC METABOLIC PANEL: EGFR: 52

## 2023-12-11 ENCOUNTER — Ambulatory Visit (INDEPENDENT_AMBULATORY_CARE_PROVIDER_SITE_OTHER): Payer: BC Managed Care – PPO | Admitting: Nurse Practitioner

## 2023-12-11 VITALS — BP 120/88 | Temp 97.6°F | Ht 62.0 in | Wt 178.1 lb

## 2023-12-11 DIAGNOSIS — R1011 Right upper quadrant pain: Secondary | ICD-10-CM

## 2023-12-11 DIAGNOSIS — E782 Mixed hyperlipidemia: Secondary | ICD-10-CM | POA: Diagnosis not present

## 2023-12-11 DIAGNOSIS — E119 Type 2 diabetes mellitus without complications: Secondary | ICD-10-CM | POA: Diagnosis not present

## 2023-12-11 DIAGNOSIS — I10 Essential (primary) hypertension: Secondary | ICD-10-CM | POA: Diagnosis not present

## 2023-12-11 DIAGNOSIS — R109 Unspecified abdominal pain: Secondary | ICD-10-CM | POA: Insufficient documentation

## 2023-12-11 NOTE — Assessment & Plan Note (Signed)
 Chronic, improved Blood pressure much improved with olmesartan increased to 40 mg daily.  For now continue this dose. Will check metabolic panel again today to see if liver enzymes are elevated concerning her abdominal pain. Further additions may be made based on these results.

## 2023-12-11 NOTE — Assessment & Plan Note (Addendum)
 Acute, improving Etiology unclear EKG: NSR We discussed evaluation with cardiology, but patient declines referral today. If all other work-up negative we may reconsider referral to cardiology.  Labs ordered for further evaluation will sort ultrasound for further evaluation.  Further recommendations may be made based on his results.

## 2023-12-11 NOTE — Assessment & Plan Note (Signed)
 Labs ordered, further recommendations may be made based upon his results.

## 2023-12-11 NOTE — Progress Notes (Signed)
 Established Patient Office Visit  Subjective   Patient ID: [name], female    DOB: 1966-02-26  Age: 58 y.o. MRN: [id number]  Chief Complaint  Patient presents with   Hypertension    HTN: Patient is here for follow-up of high blood pressure.  We increased olmesartan from 20 to 40 mg at last office visit.  Overall she is tolerating medication well but is experiencing some right upper quadrant pain that radiates to her back, it is intermittent.  She reports that this will happen to her anytime she makes adjustments to medications in general.  She recalls similar symptoms when she adjusted her topiramate in the past.  She reports she went to the emergency department at that time as well for further evaluation.  Per chart review I do see her ER note from 07/26/2023 at which time workup was unremarkable and included CBC, metabolic panel, troponin, and urinalysis.  She also underwent CTA which is negative for pulmonary embolism.  She reports since being on the olmesartan the pain seems to be slowly getting better, she does recall recently walking around the mall for exercise and experiencing worsening pain at that time. She had cardio stress test in 2023 which did not identify any ischemia.  She denies any nausea, vomiting, she did have cough for about a day but this has resolved, no sputum production, no shortness of breath, bowel movements are normal, no pain or blood seen in urine.  She has been using heat or ice both of which seem to help symptoms marginally.  She had metabolic panel checked at lab located at her job about 1 week after increasing dose of olmesartan results are as follows Metabolic panel: Glucose 101 BUN 14 Creatinine 1.20 eGFR 52 Sodium 136 Potassium 4.0 Chloride 100 CO2 21 Calcium 9.2  Patient was able to look up her [facility] portal to show me lab results from 09/21/23. They are as follows: CMP: Glucose 88 BUN 11 Creatinine 1.11 eGFR 57 Sodium  137 Potassium 4.5 Chloride 102 CO2 21 Calcium 9.0 Total protein 6.8 Albumin 4.3 Total globulin 2.5 Total bilirubin 0.5 Alkaline phosphatase 97 AST 20 ALT 26    ROS: see HPI    Objective:     BP 120/88   Temp 97.6 F (36.4 C) (Temporal)   Ht 5\' 2"  (1.575 m)   Wt 178 lb 2 oz (80.8 kg)   SpO2 99%   BMI 32.58 kg/m  BP Readings from Last 3 Encounters:  12/11/23 120/88  11/13/23 (!) 142/94  09/18/23 124/80   Wt Readings from Last 3 Encounters:  12/11/23 178 lb 2 oz (80.8 kg)  11/13/23 178 lb 4 oz (80.9 kg)  09/18/23 176 lb (79.8 kg)      Physical Exam Vitals reviewed.  Constitutional:      General: She is not in acute distress.    Appearance: Normal appearance.  HENT:     Head: Normocephalic and atraumatic.  Neck:     Vascular: No carotid bruit.  Cardiovascular:     Rate and Rhythm: Normal rate and regular rhythm.     Pulses: Normal pulses.     Heart sounds: Normal heart sounds.  Pulmonary:     Effort: Pulmonary effort is normal.     Breath sounds: Normal breath sounds.  Abdominal:     General: Abdomen is flat. There is no distension.     Tenderness: There is no abdominal tenderness.  Skin:    General: Skin is warm and dry.  Neurological:     General: No focal deficit present.     Mental Status: She is alert and oriented to person, place, and time.  Psychiatric:        Mood and Affect: Mood normal.        Behavior: Behavior normal.        Judgment: Judgment normal.    EKG: Normal sinus rhythm, no ST elevation.    No results found for any visits on 12/11/23.    The 10-year ASCVD risk score (Arnett DK, et al., 2019) is: 19.7%    Assessment & Plan:   Problem List Items Addressed This Visit       Cardiovascular and Mediastinum   Essential hypertension - Primary   Chronic, improved Blood pressure much improved with olmesartan increased to 40 mg daily.  For now continue this dose. Will check metabolic panel again today to see if liver  enzymes are elevated concerning her abdominal pain. Further additions may be made based on these results.      Relevant Orders   Comprehensive metabolic panel   CBC   Urinalysis, Routine w reflex microscopic   Urine Culture   EKG 12-Lead     Endocrine   Type 2 diabetes mellitus without complication, without long-term current use of insulin (HCC)   Labs ordered, further recommendations may be made based upon his results       Relevant Orders   Hemoglobin A1c     Other   Hyperlipidemia   Abdominal pain   Acute, improving Etiology unclear EKG: NSR We discussed evaluation with cardiology, but patient declines referral today. If all other work-up negative we may reconsider referral to cardiology.  Labs ordered for further evaluation will sort ultrasound for further evaluation.  Further recommendations may be made based on his results.      Relevant Orders   Comprehensive metabolic panel   US Abdomen Limited RUQ (LIVER/GB)   Lipase   CBC   Urinalysis, Routine w reflex microscopic   Urine Culture    Return in about 7 months (around 07/10/2024) for CPE with Maralyn Sago.    Elenore Paddy, NP

## 2023-12-12 LAB — URINALYSIS, ROUTINE W REFLEX MICROSCOPIC
Bilirubin Urine: NEGATIVE
Hgb urine dipstick: NEGATIVE
Ketones, ur: NEGATIVE
Leukocytes,Ua: NEGATIVE
Nitrite: NEGATIVE
Specific Gravity, Urine: 1.02 (ref 1.000–1.030)
Total Protein, Urine: 100 — AB
Urine Glucose: NEGATIVE
Urobilinogen, UA: 0.2 (ref 0.0–1.0)
pH: 7 (ref 5.0–8.0)

## 2023-12-12 LAB — COMPREHENSIVE METABOLIC PANEL
ALT: 29 U/L (ref 0–35)
AST: 32 U/L (ref 0–37)
Albumin: 4.3 g/dL (ref 3.5–5.2)
Alkaline Phosphatase: 93 U/L (ref 39–117)
BUN: 16 mg/dL (ref 6–23)
CO2: 25 meq/L (ref 19–32)
Calcium: 9.3 mg/dL (ref 8.4–10.5)
Chloride: 102 meq/L (ref 96–112)
Creatinine, Ser: 1.37 mg/dL — ABNORMAL HIGH (ref 0.40–1.20)
GFR: 42.31 mL/min — ABNORMAL LOW (ref >60.00)
Glucose, Bld: 76 mg/dL (ref 70–99)
Potassium: 4.4 meq/L (ref 3.5–5.1)
Sodium: 136 meq/L (ref 135–145)
Total Bilirubin: 0.6 mg/dL (ref 0.2–1.2)
Total Protein: 8.3 g/dL (ref 6.0–8.3)

## 2023-12-12 LAB — CBC
HCT: 40.3 % (ref 36.0–46.0)
Hemoglobin: 13.2 g/dL (ref 12.0–15.0)
MCHC: 32.7 g/dL (ref 30.0–36.0)
MCV: 86.1 fL (ref 78.0–100.0)
Platelets: 357 10*3/uL (ref 150.0–400.0)
RBC: 4.69 Mil/uL (ref 3.87–5.11)
RDW: 14.5 % (ref 11.5–15.5)
WBC: 9.5 10*3/uL (ref 4.0–10.5)

## 2023-12-12 LAB — URINE CULTURE: Result:: NO GROWTH

## 2023-12-12 LAB — LIPASE: Lipase: 58 U/L (ref 11.0–59.0)

## 2023-12-12 LAB — HEMOGLOBIN A1C: Hgb A1c MFr Bld: 6.1 % (ref 4.6–6.5)

## 2023-12-13 ENCOUNTER — Encounter: Payer: Self-pay | Admitting: Nurse Practitioner

## 2023-12-18 ENCOUNTER — Ambulatory Visit
Admission: RE | Admit: 2023-12-18 | Discharge: 2023-12-18 | Disposition: A | Discharge status: Home / Self Care | Payer: BC Managed Care – PPO | Source: Ambulatory Visit | Attending: Nurse Practitioner | Admitting: Nurse Practitioner

## 2023-12-18 DIAGNOSIS — R1011 Right upper quadrant pain: Secondary | ICD-10-CM | POA: Diagnosis not present

## 2023-12-24 ENCOUNTER — Other Ambulatory Visit: Payer: Self-pay | Admitting: Nurse Practitioner

## 2023-12-24 DIAGNOSIS — R1011 Right upper quadrant pain: Secondary | ICD-10-CM

## 2024-01-03 ENCOUNTER — Encounter: Payer: Self-pay | Admitting: Nurse Practitioner

## 2024-01-10 ENCOUNTER — Ambulatory Visit: Admitting: Nurse Practitioner

## 2024-01-20 ENCOUNTER — Telehealth: Payer: Self-pay | Admitting: Nurse Practitioner

## 2024-01-20 NOTE — Telephone Encounter (Signed)
 Copied from CRM [id number]. Topic: General - Other >> Jan 20, 2024  3:25 PM Rodman Pickle T wrote: Reason for CRM: charlotte from imaging center is calling to see if patient needs pa before her imaging she needs a call back regarding this  [phone] ext [phone]

## 2024-01-23 ENCOUNTER — Other Ambulatory Visit

## 2024-01-30 ENCOUNTER — Ambulatory Visit: Payer: BC Managed Care – PPO | Admitting: Adult Health

## 2024-02-07 ENCOUNTER — Other Ambulatory Visit

## 2024-02-14 ENCOUNTER — Other Ambulatory Visit: Payer: Self-pay | Admitting: Nurse Practitioner

## 2024-02-14 DIAGNOSIS — E782 Mixed hyperlipidemia: Secondary | ICD-10-CM

## 2024-03-19 ENCOUNTER — Ambulatory Visit: Admitting: Adult Health

## 2024-07-16 ENCOUNTER — Encounter: Payer: BC Managed Care – PPO | Admitting: Nurse Practitioner

## 2024-07-17 ENCOUNTER — Telehealth: Payer: Self-pay | Admitting: Adult Health

## 2024-07-17 NOTE — Telephone Encounter (Signed)
 MYC cxl

## 2024-07-31 ENCOUNTER — Other Ambulatory Visit: Payer: Self-pay | Admitting: Nurse Practitioner

## 2024-07-31 DIAGNOSIS — I10 Essential (primary) hypertension: Secondary | ICD-10-CM

## 2024-09-18 ENCOUNTER — Ambulatory Visit: Admitting: Nurse Practitioner

## 2024-09-18 VITALS — BP 130/84 | HR 69 | Temp 97.7°F | Ht 62.0 in | Wt 194.0 lb

## 2024-09-18 DIAGNOSIS — Z0000 Encounter for general adult medical examination without abnormal findings: Secondary | ICD-10-CM | POA: Diagnosis not present

## 2024-09-18 DIAGNOSIS — I10 Essential (primary) hypertension: Secondary | ICD-10-CM | POA: Diagnosis not present

## 2024-09-18 DIAGNOSIS — R0683 Snoring: Secondary | ICD-10-CM | POA: Insufficient documentation

## 2024-09-18 DIAGNOSIS — Z1211 Encounter for screening for malignant neoplasm of colon: Secondary | ICD-10-CM | POA: Insufficient documentation

## 2024-09-18 DIAGNOSIS — Z1231 Encounter for screening mammogram for malignant neoplasm of breast: Secondary | ICD-10-CM | POA: Insufficient documentation

## 2024-09-18 DIAGNOSIS — M791 Myalgia, unspecified site: Secondary | ICD-10-CM | POA: Insufficient documentation

## 2024-09-18 DIAGNOSIS — E119 Type 2 diabetes mellitus without complications: Secondary | ICD-10-CM

## 2024-09-18 DIAGNOSIS — E782 Mixed hyperlipidemia: Secondary | ICD-10-CM | POA: Diagnosis not present

## 2024-09-18 DIAGNOSIS — N1832 Chronic kidney disease, stage 3b: Secondary | ICD-10-CM

## 2024-09-18 DIAGNOSIS — Z124 Encounter for screening for malignant neoplasm of cervix: Secondary | ICD-10-CM | POA: Insufficient documentation

## 2024-09-18 LAB — CBC
HCT: 39.4 % (ref 36.0–46.0)
Hemoglobin: 12.8 g/dL (ref 12.0–15.0)
MCHC: 32.5 g/dL (ref 30.0–36.0)
MCV: 84 fl (ref 78.0–100.0)
Platelets: 351 K/uL (ref 150.0–400.0)
RBC: 4.7 Mil/uL (ref 3.87–5.11)
RDW: 15.2 % (ref 11.5–15.5)
WBC: 7.7 K/uL (ref 4.0–10.5)

## 2024-09-18 LAB — LIPID PANEL
Cholesterol: 186 mg/dL (ref 0–200)
HDL: 52.8 mg/dL (ref >39.00)
LDL Cholesterol: 123 mg/dL — ABNORMAL HIGH (ref 0–99)
NonHDL: 133.32
Total CHOL/HDL Ratio: 4
Triglycerides: 51 mg/dL (ref 0.0–149.0)
VLDL: 10.2 mg/dL (ref 0.0–40.0)

## 2024-09-18 LAB — COMPREHENSIVE METABOLIC PANEL WITH GFR
ALT: 24 U/L (ref 0–35)
AST: 21 U/L (ref 0–37)
Albumin: 4.1 g/dL (ref 3.5–5.2)
Alkaline Phosphatase: 94 U/L (ref 39–117)
BUN: 11 mg/dL (ref 6–23)
CO2: 31 meq/L (ref 19–32)
Calcium: 9.1 mg/dL (ref 8.4–10.5)
Chloride: 102 meq/L (ref 96–112)
Creatinine, Ser: 1.3 mg/dL — ABNORMAL HIGH (ref 0.40–1.20)
GFR: 44.82 mL/min — ABNORMAL LOW (ref >60.00)
Glucose, Bld: 89 mg/dL (ref 70–99)
Potassium: 4.3 meq/L (ref 3.5–5.1)
Sodium: 139 meq/L (ref 135–145)
Total Bilirubin: 0.5 mg/dL (ref 0.2–1.2)
Total Protein: 7.8 g/dL (ref 6.0–8.3)

## 2024-09-18 LAB — MICROALBUMIN / CREATININE URINE RATIO
Creatinine,U: 77.5 mg/dL
Microalb Creat Ratio: 1847.8 mg/g — ABNORMAL HIGH (ref 0.0–30.0)
Microalb, Ur: 143.2 mg/dL — ABNORMAL HIGH (ref 0.0–1.9)

## 2024-09-18 LAB — TSH: TSH: 0.91 u[IU]/mL (ref 0.35–5.50)

## 2024-09-18 LAB — POC COVID19 BINAXNOW: SARS Coronavirus 2 Ag: NEGATIVE

## 2024-09-18 LAB — POCT INFLUENZA A/B
Influenza A, POC: NEGATIVE
Influenza B, POC: NEGATIVE

## 2024-09-18 LAB — HEMOGLOBIN A1C: Hgb A1c MFr Bld: 6.3 % (ref 4.6–6.5)

## 2024-09-18 MED ORDER — ROSUVASTATIN CALCIUM 10 MG PO TABS: 10.0000 mg | ORAL_TABLET | Freq: Every day | ORAL | 1 refills | Status: DC

## 2024-09-18 MED ORDER — CYCLOBENZAPRINE HCL 5 MG PO TABS: 5.0000 mg | ORAL_TABLET | Freq: Three times a day (TID) | ORAL | 1 refills | Status: AC | PRN

## 2024-09-18 NOTE — Progress Notes (Signed)
 Complete physical exam  Patient: [name]   DOB: August 11, 1966   58 y.o. Female  MRN: [id number]  Subjective:    Chief Complaint  Patient presents with   Annual Exam    [name] is a 58 y.o. female who presents today for a complete physical exam. Discussed the use of AI scribe software for clinical note transcription with the patient, who gave verbal consent to proceed.  History of Present Illness [name] is a 58 year old female with hypertension and chronic kidney disease who presents with respiratory symptoms and muscle pain.  Acute respiratory symptoms - Onset yesterday of cough, chills, wheezing, and occasional phlegm production - No fever  Chronic musculoskeletal pain - Chronic shoulder muscle aches predating current respiratory illness - Pain interferes with sleep - Uses heat mat at night for relief - Requests muscle relaxant for symptom management  Weight gain and fluid retention - Nearly twenty pound weight gain since last visit - Attributes weight gain to menopause and possible fluid retention  Diabetes management and medication side effects - Previously used Ozempic  for diabetes - Discontinued Ozempic  due to worsening hypertension and development of papilledema - Neurologic workup for papilledema showed no significant findings - Treated with Topamax , which caused severe side effects and depression - Required leave from work due to medication side effects - Has not had yearly diabetic eye exam, no longer on Topamax      Most recent fall risk assessment:    11/13/2023    4:05 PM  Fall Risk   Falls in the past year? 0  Number falls in past yr: 0  Injury with Fall? 0   Risk for fall due to : No Fall Risks  Follow up Falls evaluation completed     Data saved with a previous flowsheet row definition     Most recent depression screenings:    11/13/2023    4:05 PM 04/25/2023    2:25 PM  PHQ 2/9 Scores  PHQ - 2 Score 0 0       Past Medical History:  Diagnosis Date   Encounter for screening mammogram for malignant neoplasm of breast 02/02/2022   Essential hypertension    Fibroids    IDA (iron deficiency anemia)    Neck mass    posterior   Scalp mass 02/25/2012   Past Surgical History:  Procedure Laterality Date   cervix cerclage  1987   LIPOSUCTION N/A 08/17/2019   Procedure: LIPOSUCTION POSTERIOR NECK;  Surgeon: Marcus Lung, MD;  Location: [facility];  Service: Plastics;  Laterality: N/A;   TOTAL ABDOMINAL HYSTERECTOMY  04/13/2005   TUBAL LIGATION     WISDOM TOOTH EXTRACTION     Social History   Socioeconomic History   Marital status: Single    Spouse name: Not on file   Number of children: 3   Years of education: 14   Highest education level: Associate degree: academic program  Occupational History   Occupation: Coordinator  Tobacco Use   Smoking status: Every Day    Types: E-cigarettes, Cigarettes   Smokeless tobacco: Current  Vaping Use   Vaping status: Every Day  Substance and Sexual Activity   Alcohol use: Yes    Comment: occ   Drug use: No   Sexual activity: Not Currently    Birth control/protection: Surgical    Comment: 1st intercourse 14 yo-5 partners  Other Topics Concern   Not on file  Social History Narrative   Fun:  Family time, movies and relaxing, outdoors   Feels safe at home and denies abuse   Social Drivers of Corporate Investment Banker Strain: Low Risk  (11/13/2023)   Overall Financial Resource Strain (CARDIA)    Difficulty of Paying Living Expenses: Not very hard  Food Insecurity: No Food Insecurity (11/13/2023)   Hunger Vital Sign    Worried About Running Out of Food in the Last Year: Never true    Ran Out of Food in the Last Year: Never true  Transportation Needs: No Transportation Needs (11/13/2023)   PRAPARE - Administrator, Civil Service (Medical): No    Lack of Transportation (Non-Medical): No  Physical Activity:  Sufficiently Active (11/13/2023)   Exercise Vital Sign    Days of Exercise per Week: 5 days    Minutes of Exercise per Session: 30 min  Stress: No Stress Concern Present (11/13/2023)   [employer] of Occupational Health - Occupational Stress Questionnaire    Feeling of Stress : Not at all  Social Connections: Moderately Integrated (11/13/2023)   Social Connection and Isolation Panel    Frequency of Communication with Friends and Family: More than three times a week    Frequency of Social Gatherings with Friends and Family: Once a week    Attends Religious Services: More than 4 times per year    Active Member of [employer] or Organizations: Yes    Attends Banker Meetings: 1 to 4 times per year    Marital Status: Divorced  Intimate Partner Violence: Unknown (05/17/2023)   Received from [facility]   HITS    Physically Hurt: Not on file    Insult or Talk Down To: Not on file    Threaten Physical Harm: Not on file    Scream or Curse: Not on file   Family History  Problem Relation Age of Onset   Heart disease Father    Hypertension Father    Stroke Father    Hypertension Mother    Multiple sclerosis Mother    Cancer Maternal Grandmother        ?type   Diabetes Maternal Grandfather    Healthy Paternal Grandmother    Hypertension Brother    Asthma Sister    Emphysema Sister    Lung cancer Sister    Sickle cell trait Daughter    Allergies  Allergen Reactions   Tylox [Oxycodone -Acetaminophen ] Itching   Tyloxapol Hives and Other (See Comments)      Patient Care Team: Elnor Lauraine BRAVO, NP as PCP - General (Nurse Practitioner) Katha Hamilton, PA-C as Physician Assistant (Physician Assistant)   Outpatient Medications Prior to Visit  Medication Sig   blood glucose meter kit and supplies KIT Dispense based on patient and insurance preference. Check blood sugar every day prior to first meal.   cholecalciferol (VITAMIN D3) 25 MCG (1000 UT) tablet Take 1,000 Units by mouth  daily.   Multiple Vitamins-Minerals (WOMENS MULTIVITAMIN) TABS See admin instructions.   olmesartan  (BENICAR ) 40 MG tablet TAKE 1 TABLET BY MOUTH EVERY DAY   prochlorperazine  (COMPAZINE ) 10 MG tablet Take 1 tablet (10 mg total) by mouth 2 (two) times daily as needed for nausea or vomiting (headache, can take with benadryl ).   [DISCONTINUED] rosuvastatin  (CRESTOR ) 10 MG tablet TAKE 1 TABLET BY MOUTH EVERY DAY   [DISCONTINUED] topiramate  (TOPAMAX ) 25 MG tablet Take 2 tablets (50 mg total) by mouth 2 (two) times daily.   [DISCONTINUED] traMADol  (ULTRAM ) 50 MG tablet Take 1 tablet (  50 mg total) by mouth every 6 (six) hours as needed.   No facility-administered medications prior to visit.    Review of Systems  Constitutional:  Negative for chills and fever.  HENT:  Positive for congestion.        (+) snoring  Eyes:  Negative for blurred vision and double vision.  Respiratory:  Positive for cough, sputum production and wheezing. Negative for shortness of breath.   Cardiovascular:  Negative for chest pain and palpitations.  Gastrointestinal:  Negative for abdominal pain and blood in stool.  Musculoskeletal:  Positive for myalgias.  Neurological:  Negative for headaches.          Objective:     BP 130/84   Pulse 69   Temp 97.7 F (36.5 C) (Temporal)   Ht 5' 2 (1.575 m)   Wt 194 lb (88 kg)   SpO2 98%   BMI 35.48 kg/m  BP Readings from Last 3 Encounters:  09/18/24 130/84  12/11/23 120/88  11/13/23 (!) 142/94   Wt Readings from Last 3 Encounters:  09/18/24 194 lb (88 kg)  12/11/23 178 lb 2 oz (80.8 kg)  11/13/23 178 lb 4 oz (80.9 kg)      Physical Exam Vitals reviewed.  Constitutional:      Appearance: Normal appearance.  HENT:     Head: Normocephalic and atraumatic.     Right Ear: Tympanic membrane, ear canal and external ear normal.     Left Ear: Tympanic membrane, ear canal and external ear normal.  Eyes:     General:        Right eye: No discharge.        Left  eye: No discharge.     Extraocular Movements: Extraocular movements intact.     Conjunctiva/sclera: Conjunctivae normal.     Pupils: Pupils are equal, round, and reactive to light.  Neck:     Vascular: No carotid bruit.  Cardiovascular:     Rate and Rhythm: Normal rate and regular rhythm.     Pulses: Normal pulses.     Heart sounds: Normal heart sounds. No murmur heard. Pulmonary:     Effort: Pulmonary effort is normal.     Breath sounds: Normal breath sounds.  Abdominal:     General: Abdomen is flat. Bowel sounds are normal. There is no distension.     Palpations: Abdomen is soft. There is no mass.     Tenderness: There is no abdominal tenderness.  Musculoskeletal:        General: No tenderness.     Cervical back: Neck supple. No muscular tenderness.     Right lower leg: No edema.     Left lower leg: No edema.  Lymphadenopathy:     Cervical: No cervical adenopathy.     Upper Body:     Right upper body: No supraclavicular adenopathy.     Left upper body: No supraclavicular adenopathy.  Skin:    General: Skin is warm and dry.  Neurological:     General: No focal deficit present.     Mental Status: She is alert and oriented to person, place, and time.     Motor: No weakness.     Gait: Gait normal.  Psychiatric:        Mood and Affect: Mood normal.        Behavior: Behavior normal.        Judgment: Judgment normal.      Results for orders placed or performed in visit on 09/18/24  POC COVID-19 BinaxNow  Result Value Ref Range   SARS Coronavirus 2 Ag Negative Negative  POCT Influenza A/B  Result Value Ref Range   Influenza A, POC Negative Negative   Influenza B, POC Negative Negative       Assessment & Plan:    Routine Health Maintenance and Physical Exam  Immunization History  Administered Date(s) Administered   Influenza, Seasonal, Injecte, Preservative Fre 08/04/2015   Td 07/13/2015   Zoster Recombinant(Shingrix) 02/12/2021    Health Maintenance  Topic  Date Due   Pneumococcal Vaccine: 50+ Years (1 of 2 - PCV) Never done   Colonoscopy  Never done   Zoster Vaccines- Shingrix (2 of 2) 04/09/2021   FOOT EXAM  01/24/2024   Diabetic kidney evaluation - Urine ACR  01/27/2024   HEMOGLOBIN A1C  06/09/2024   OPHTHALMOLOGY EXAM  06/25/2024   Influenza Vaccine  01/12/2025 (Originally 05/15/2024)   Diabetic kidney evaluation - eGFR measurement  12/10/2024   Mammogram  05/27/2025   DTaP/Tdap/Td (2 - Tdap) 07/12/2025   Hepatitis C Screening  Completed   HIV Screening  Completed   Hepatitis B Vaccines 19-59 Average Risk  Aged Out   HPV VACCINES  Aged Out   Meningococcal B Vaccine  Aged Out   COVID-19 Vaccine  Discontinued    Discussed health benefits of physical activity, and encouraged her to engage in regular exercise appropriate for her age and condition.  Problem List Items Addressed This Visit       Cardiovascular and Mediastinum   Essential hypertension   Relevant Medications   rosuvastatin  (CRESTOR ) 10 MG tablet   Other Relevant Orders   CBC   Comprehensive metabolic panel with GFR   Hemoglobin A1c   Lipid panel   TSH   Microalbumin / creatinine urine ratio     Endocrine   Type 2 diabetes mellitus without complication, without long-term current use of insulin (HCC)   Relevant Medications   rosuvastatin  (CRESTOR ) 10 MG tablet   Other Relevant Orders   CBC   Comprehensive metabolic panel with GFR   Hemoglobin A1c   Lipid panel   TSH   Microalbumin / creatinine urine ratio   Ambulatory referral to Ophthalmology     Genitourinary   Stage 3b chronic kidney disease (HCC)   Relevant Orders   CBC   Comprehensive metabolic panel with GFR   Hemoglobin A1c   Lipid panel   TSH   Microalbumin / creatinine urine ratio     Other   Hyperlipidemia   Relevant Medications   rosuvastatin  (CRESTOR ) 10 MG tablet   Other Relevant Orders   CBC   Comprehensive metabolic panel with GFR   Hemoglobin A1c   Lipid panel   TSH    Microalbumin / creatinine urine ratio   Myalgia - Primary   Relevant Medications   cyclobenzaprine  (FLEXERIL ) 5 MG tablet   Other Relevant Orders   CBC   Comprehensive metabolic panel with GFR   Hemoglobin A1c   Lipid panel   TSH   Microalbumin / creatinine urine ratio   POC COVID-19 BinaxNow (Completed)   POCT Influenza A/B (Completed)   Colon cancer screening   Relevant Orders   Ambulatory referral to Gastroenterology   CBC   Comprehensive metabolic panel with GFR   Hemoglobin A1c   Lipid panel   TSH   Microalbumin / creatinine urine ratio   Encounter for screening mammogram for malignant neoplasm of breast   Relevant Orders   MM 3D  SCREENING MAMMOGRAM BILATERAL BREAST   Cervical cancer screening   Relevant Orders   Ambulatory referral to Obstetrics / Gynecology   Snoring   Relevant Orders   Ambulatory referral to Neurology   Assessment and Plan Assessment & Plan Acute respiratory symptoms: cough, chills, and wheezing Acute respiratory symptoms with no fever and clear lungs, ruling out pneumonia. - Swabbed for COVID-19 and influenza. Both tests negative. Likely viral in etiology. Treat symptoms.  - Monitor symptoms and follow up if symptoms do not resolve in 10-14 days.  Myalgia of shoulders Chronic shoulder pain exacerbated by recent illness. Discussed muscle relaxants, preferring cyclobenzaprine  for nighttime use. - Prescribed cyclobenzaprine  3 times a day as needed for pain. - Advised against driving, alcohol consumption, and operating heavy machinery if taken during the day.  Type 2 diabetes mellitus with associated obesity Previous A1c of 6.1 (Peak A1C of 6.7 in 01/2022). Discussed Mounjaro for glycemic control and weight loss. Noted adverse effects with Ozempic . Plan to consult Dr. Geofm regarding [facility] safety due to papilledema history. - Ordered updated labs including A1c. - Referred for diabetic eye exam at [facility]. - Will consult with Dr.  Geofm regarding Pat use. Obesity with weight gain Weight gain of 20 pounds since last visit. Discussed potential causes including menopause and fluid retention. Considered sleep apnea. Previous Ozempic  trial ineffective with adverse effects. Discussed potential Mounjaro trial. - Referred for sleep study to evaluate for sleep apnea. - Discussed potential trial of Mounjaro pending further consultation.  Essential hypertension Hypertension management ongoing, stable blood pressure - Continue olmesartan  40mg /day  Chronic kidney disease, stage 3b Stage 3b CKD well-managed. Discussed potential causes of fluid retention. - Ordered updated labs to assess kidney function.  Mixed hyperlipidemia Managed with rosuvastatin . - Refilled rosuvastatin  prescription.  Possible sleep apnea and snoring Possible sleep apnea suggested by difficulty sleeping and snoring. Discussed potential impact on weight and health. - Referred for sleep study.  Colon cancer screening Screening never completed. Discussed importance and plan to initiate. - Referred to gastroenterologist for colon cancer screening.  Screening mammogram for breast cancer Screening mammogram overdue. Previous mammogram in August 2023 was normal. - Ordered screening mammogram.  In addition to annual physical exam I also completed and office visit as detailed above.   Return in about 3 months (around 12/17/2024) for F/U with Payton Prinsen.     Lauraine FORBES Pereyra, NP

## 2024-09-21 ENCOUNTER — Ambulatory Visit: Payer: Self-pay | Admitting: Nurse Practitioner

## 2024-09-21 DIAGNOSIS — E785 Hyperlipidemia, unspecified: Secondary | ICD-10-CM

## 2024-09-21 DIAGNOSIS — N1832 Chronic kidney disease, stage 3b: Secondary | ICD-10-CM

## 2024-09-21 DIAGNOSIS — R809 Proteinuria, unspecified: Secondary | ICD-10-CM

## 2024-09-21 MED ORDER — ROSUVASTATIN CALCIUM 20 MG PO TABS: 20.0000 mg | ORAL_TABLET | Freq: Every day | ORAL | 3 refills | Status: AC

## 2024-09-25 ENCOUNTER — Encounter: Payer: Self-pay | Admitting: Nurse Practitioner

## 2024-09-28 ENCOUNTER — Telehealth: Payer: Self-pay

## 2024-09-28 NOTE — Telephone Encounter (Signed)
 Copied from CRM [id number]. Topic: Clinical - Lab/Test Results >> Sep 28, 2024  1:30 PM [name] wrote: Reason for CRM: Patient called in stating she wants to go over her most recent lab results with her PCP  AMB to Nephrology for Recent diagnosis.  Please F/U with patient ASAP with results.

## 2024-09-29 NOTE — Telephone Encounter (Unsigned)
 Copied from CRM #[id number]. Topic: General - Other >> Sep 29, 2024  3:00 PM Berneda F wrote: Reason for CRM: Patient is returning a phone call to Ronnald about her lab results. Called [facility] to see if Ronnald was available and spoke with Dianne who recommended to send CRM to clinic.  Please call patient back at [phone]

## 2024-10-02 NOTE — Telephone Encounter (Signed)
 My chart message has been send to pt.

## 2024-10-12 ENCOUNTER — Ambulatory Visit: Admitting: Adult Health

## 2024-10-19 ENCOUNTER — Telehealth: Payer: Self-pay | Admitting: Neurology

## 2024-10-19 NOTE — Telephone Encounter (Signed)
 Pt has r/s due to a conflict, she is on wait list

## 2024-10-21 ENCOUNTER — Ambulatory Visit: Admitting: Adult Health

## 2024-11-05 ENCOUNTER — Encounter: Payer: Self-pay | Admitting: Neurology

## 2024-11-05 ENCOUNTER — Ambulatory Visit: Admitting: Neurology

## 2024-11-05 VITALS — BP 120/90 | HR 76 | Ht 62.0 in | Wt 193.0 lb

## 2024-11-05 DIAGNOSIS — Z9189 Other specified personal risk factors, not elsewhere classified: Secondary | ICD-10-CM

## 2024-11-05 DIAGNOSIS — R0683 Snoring: Secondary | ICD-10-CM

## 2024-11-05 DIAGNOSIS — Z820 Family history of epilepsy and other diseases of the nervous system: Secondary | ICD-10-CM

## 2024-11-05 DIAGNOSIS — E669 Obesity, unspecified: Secondary | ICD-10-CM

## 2024-11-05 DIAGNOSIS — G4719 Other hypersomnia: Secondary | ICD-10-CM | POA: Diagnosis not present

## 2024-11-05 DIAGNOSIS — R635 Abnormal weight gain: Secondary | ICD-10-CM

## 2024-11-05 DIAGNOSIS — R030 Elevated blood-pressure reading, without diagnosis of hypertension: Secondary | ICD-10-CM | POA: Diagnosis not present

## 2024-11-05 DIAGNOSIS — R351 Nocturia: Secondary | ICD-10-CM | POA: Diagnosis not present

## 2024-11-05 NOTE — Progress Notes (Signed)
 Subjective:    Patient ID: [name] is a 59 y.o. female.  HPI    True Mar, MD, PhD [facility] [address], [address] [address] [place], [place] [address]   Dear Lauraine,  I saw your patient, [name], upon your kind request in my sleep clinic today for initial consultation of her sleep disorder, in particular, concern for underlying obstructive sleep apnea.  The patient is unaccompanied today.  As you know, Mr. Holsclaw is a 59 year old female with an underlying medical history of IIH (seen by Dr. Onita in 2024), hyperlipidemia, chronic kidney disease, diabetes, hypertension, and obesity, who reports snoring and excessive daytime somnolence.  Her Epworth sleepiness score is 11 out of 24, fatigue severity score is 12/63.  I reviewed your office note from 09/18/2024.  She reported muscle aches at the time which interfered with her sleep, she was given a prescription for Flexeril .  She has never had a sleep study. She restarted smoking about 2 years after stopping for about 7 years, currently 1/2 ppd. She drinks about 4 c coffee, about 1 bottle or less of soda per day, no alcohol. She has had weight fluctuation, currently on the higher side. She lives alone, has one dog in the household. She has a TV in the BR, and it tends to stay on all night.  She is divorced, she has 3 grown children, she also has grandchildren.  She works for [employer].  She works typically from 7:30 AM to 3:30 PM.  Bedtime is generally between 9 and 10 and rise time around 5:30 AM.  She has nocturia about once per average night, denies recurrent nocturnal or morning headaches.  She reports that her sister had obstructive sleep apnea.  Sister passed away in her early 1s.  The patient has 1 dog in the household.  She was able to lose weight on Ozempic  but is no longer on it.  She had side effects from it including vertigo.  Her Past Medical History Is Significant For: Past  Medical History:  Diagnosis Date   Encounter for screening mammogram for malignant neoplasm of breast 02/02/2022   Essential hypertension    Fibroids    IDA (iron deficiency anemia)    Neck mass    posterior   Scalp mass 02/25/2012    Her Past Surgical History Is Significant For: Past Surgical History:  Procedure Laterality Date   cervix cerclage  1987   LIPOSUCTION N/A 08/17/2019   Procedure: LIPOSUCTION POSTERIOR NECK;  Surgeon: Marcus Lung, MD;  Location: [facility];  Service: Plastics;  Laterality: N/A;   TOTAL ABDOMINAL HYSTERECTOMY  04/13/2005   TUBAL LIGATION     WISDOM TOOTH EXTRACTION      Her Family History Is Significant For: Family History  Problem Relation Age of Onset   Hypertension Mother    Multiple sclerosis Mother    Heart disease Father    Hypertension Father    Stroke Father    Asthma Sister    Emphysema Sister    Lung cancer Sister    Hypertension Brother    Cancer Maternal Grandmother        ?type   Diabetes Maternal Grandfather    Healthy Paternal Grandmother    Sickle cell trait Daughter    Sleep apnea Neg Hx     Her Social History Is Significant For: Social History   Socioeconomic History   Marital status: Single    Spouse name: Not on  file   Number of children: 3   Years of education: 14   Highest education level: Associate degree: academic program  Occupational History   Occupation: Coordinator  Tobacco Use   Smoking status: Every Day    Current packs/day: 1.50    Types: Cigarettes   Smokeless tobacco: Current  Vaping Use   Vaping status: Not on file  Substance and Sexual Activity   Alcohol use: Not Currently    Comment: occ   Drug use: No   Sexual activity: Not Currently    Birth control/protection: Surgical    Comment: 1st intercourse 37 yo-5 partners  Other Topics Concern   Not on file  Social History Narrative   Fun: Family time, movies and relaxing, outdoors   Feels safe at home and denies abuse    Pt lives alone    Pt works    Social Drivers of Health   Tobacco Use: High Risk (11/05/2024)   Patient History    Smoking Tobacco Use: Every Day    Smokeless Tobacco Use: Current    Passive Exposure: Not on file  Financial Resource Strain: Low Risk (11/13/2023)   Overall Financial Resource Strain (CARDIA)    Difficulty of Paying Living Expenses: Not very hard  Food Insecurity: No Food Insecurity (11/13/2023)   Hunger Vital Sign    Worried About Running Out of Food in the Last Year: Never true    Ran Out of Food in the Last Year: Never true  Transportation Needs: No Transportation Needs (11/13/2023)   PRAPARE - Administrator, Civil Service (Medical): No    Lack of Transportation (Non-Medical): No  Physical Activity: Sufficiently Active (11/13/2023)   Exercise Vital Sign    Days of Exercise per Week: 5 days    Minutes of Exercise per Session: 30 min  Stress: No Stress Concern Present (11/13/2023)   [employer] of Occupational Health - Occupational Stress Questionnaire    Feeling of Stress : Not at all  Social Connections: Moderately Integrated (11/13/2023)   Social Connection and Isolation Panel    Frequency of Communication with Friends and Family: More than three times a week    Frequency of Social Gatherings with Friends and Family: Once a week    Attends Religious Services: More than 4 times per year    Active Member of Clubs or Organizations: Yes    Attends Banker Meetings: 1 to 4 times per year    Marital Status: Divorced  Depression (PHQ2-9): Low Risk (11/13/2023)   Depression (PHQ2-9)    PHQ-2 Score: 0  Alcohol Screen: Low Risk (11/13/2023)   Alcohol Screen    Last Alcohol Screening Score (AUDIT): 1  Housing: Low Risk (11/13/2023)   Housing Stability Vital Sign    Unable to Pay for Housing in the Last Year: No    Number of Times Moved in the Last Year: 0    Homeless in the Last Year: No  Utilities: Not on file  Health Literacy: Not on  file    Her Allergies Are:  Allergies[1]:   Her Current Medications Are:  Outpatient Encounter Medications as of 11/05/2024  Medication Sig   blood glucose meter kit and supplies KIT Dispense based on patient and insurance preference. Check blood sugar every day prior to first meal.   cholecalciferol (VITAMIN D3) 25 MCG (1000 UT) tablet Take 1,000 Units by mouth daily.   cyclobenzaprine  (FLEXERIL ) 5 MG tablet Take 1 tablet (5 mg total) by mouth 3 (three)  times daily as needed for muscle spasms.   Multiple Vitamins-Minerals (WOMENS MULTIVITAMIN) TABS See admin instructions.   olmesartan  (BENICAR ) 40 MG tablet TAKE 1 TABLET BY MOUTH EVERY DAY   prochlorperazine  (COMPAZINE ) 10 MG tablet Take 1 tablet (10 mg total) by mouth 2 (two) times daily as needed for nausea or vomiting (headache, can take with benadryl ).   rosuvastatin  (CRESTOR ) 20 MG tablet Take 1 tablet (20 mg total) by mouth daily.   No facility-administered encounter medications on file as of 11/05/2024.  :   Review of Systems:  Out of a complete 14 point review of systems, all are reviewed and negative with the exception of these symptoms as listed below:   Review of Systems  Objective:  Neurological Exam  Physical Exam Physical Examination:   Vitals:   11/05/24 1413  BP: (!) 120/90  Pulse: 76    General Examination: The patient is a very pleasant 59 y.o. female in no acute distress. She appears well-developed and well-nourished and well groomed.   HEENT: Normocephalic, atraumatic, pupils are equal, round and reactive to light, extraocular tracking is good without limitation to gaze excursion or nystagmus noted. No photophobia. No corrective eye glasses in place. Hearing is grossly intact.  Face is symmetric with normal facial animation. Speech is clear without dysarthria. There is no hypophonia. There is no lip, neck/head, jaw or voice tremor. Neck is supple with full range of passive and active motion. There are no  carotid bruits on auscultation.  Airway/Oropharynx exam reveals: mild mouth dryness, adequate dental hygiene and moderate airway crowding, due to smaller airway entry, tonsils 1+ bilaterally, wider tongue. Mallampati is class III. Tongue protrudes centrally and palate elevates symmetrically. Neck size is 14.5 in. Minimal overbite noted.  Chest: Clear to auscultation without wheezing, rhonchi or crackles noted.  Heart: S1+S2+0, regular and normal without murmurs, rubs or gallops noted.   Abdomen: Soft, non-tender and non-distended.  Extremities: There is non-pitting puffiness noted in the distal lower extremities bilaterally.   Skin: Warm and dry without trophic changes noted.   Musculoskeletal: exam reveals no obvious joint deformities.   Neurologically:  Mental status: The patient is awake, alert and oriented in all 4 spheres. Her immediate and remote memory, attention, language skills and fund of knowledge are appropriate. There is no evidence of aphasia, agnosia, apraxia or anomia. Speech is clear with normal prosody and enunciation. Thought process is linear. Mood is normal and affect is normal.  Cranial nerves II - XII are as described above under HEENT exam.  Motor exam: Normal bulk, strength and tone is noted. There is no obvious action or resting tremor.  Fine motor skills and coordination: Intact grossly.  Cerebellar testing: No dysmetria or intention tremor. There is no truncal or gait ataxia.  Sensory exam: intact to light touch in the upper and lower extremities.  Gait, station and balance: She stands easily. No veering to one side is noted. No leaning to one side is noted. Posture is age-appropriate and stance is narrow based. Gait shows normal stride length and normal pace. No problems turning are noted.   Assessment and Plan:   In summary, [name] is a 59 year old female with an underlying medical history of IIH (seen by Dr. Onita in 2024), hyperlipidemia, chronic  kidney disease, diabetes, hypertension, and obesity, whose history and physical exam are concerning for sleep disordered breathing, particularly obstructive sleep apnea (OSA). A laboratory attended sleep study is typically considered gold standard for evaluation of sleep disordered  breathing.   I had a long chat with the patient about my findings and the diagnosis of sleep apnea, particularly OSA, its prognosis and treatment options. We talked about medical/conservative treatments, surgical interventions and non-pharmacological approaches for symptom control. I explained, in particular, the risks and ramifications of untreated moderate to severe OSA, especially with respect to developing cardiovascular disease down the road, including congestive heart failure (CHF), difficult to treat hypertension, cardiac arrhythmias (particularly A-fib), neurovascular complications including TIA, stroke and dementia. Even type 2 diabetes has, in part, been linked to untreated OSA. Symptoms of untreated OSA may include (but may not be limited to) daytime sleepiness, nocturia (i.e. frequent nighttime urination), memory problems, mood irritability and suboptimally controlled or worsening mood disorder such as depression and/or anxiety, lack of energy, lack of motivation, physical discomfort, as well as recurrent headaches, especially morning or nocturnal headaches. We talked about the importance of maintaining a healthy lifestyle and striving for healthy weight.  The importance of complete smoking cessation was also addressed.  In addition, we talked about the importance of striving for and maintaining good sleep hygiene. I recommended a sleep study at this time. I outlined the differences between a laboratory attended sleep study which is considered more comprehensive and accurate over the option of a home sleep test (HST); the latter may lead to underestimation of sleep disordered breathing in some instances and does not help  with diagnosing upper airway resistance syndrome and is not accurate enough to diagnose primary central sleep apnea typically. I outlined possible surgical and non-surgical treatment options of OSA, including the use of a positive airway pressure (PAP) device (i.e. CPAP, AutoPAP/APAP or BiPAP in certain circumstances), a custom-made dental device (aka oral appliance, which would require a referral to a specialist dentist or orthodontist typically, and is generally speaking not considered for patients with full dentures or edentulous state), upper airway surgical options, such as traditional UPPP (which is not considered a first-line treatment) or the Inspire device (hypoglossal nerve stimulator, which would involve a referral for consultation with an ENT surgeon, after careful selection, following inclusion criteria - also not first-line treatment). I explained the PAP treatment option to the patient in detail, as this is generally considered first-line treatment.  The patient indicated that she would be willing to try PAP therapy, if the need arises. I explained the importance of being compliant with PAP treatment, not only for insurance purposes but primarily to improve patient's symptoms symptoms, and for the patient's long term health benefit, including to reduce Her cardiovascular risks longer-term.    We will pick up our discussion about the next steps and treatment options after testing.  We will keep her posted as to the test results by phone call and/or MyChart messaging where possible.  We will plan to follow-up in sleep clinic accordingly as well.  I answered all her questions today and the patient was in agreement.   I encouraged her to call with any interim questions, concerns, problems or updates or email us  through MyChart.  Generally speaking, sleep test authorizations may take up to 2 weeks, sometimes less, sometimes longer, the patient is encouraged to get in touch with us  if they do not hear  back from the sleep lab staff directly within the next 2 weeks.  Thank you very much for allowing me to participate in the care of this nice patient. If I can be of any further assistance to you please do not hesitate to call me at [phone].  Sincerely,   True Mar, MD, PhD     [1]  Allergies Allergen Reactions   Tylox [Oxycodone -Acetaminophen ] Itching   Tyloxapol Hives and Other (See Comments)

## 2024-11-05 NOTE — Patient Instructions (Signed)

## 2024-11-06 ENCOUNTER — Ambulatory Visit

## 2024-11-12 ENCOUNTER — Encounter: Payer: Self-pay | Admitting: *Deleted

## 2024-11-12 NOTE — Progress Notes (Signed)
 [name]                                          MRN: [id number]   11/12/2024   The [facility] Quality Team Specialist reviewed this patient medical record for the purposes of chart review for care gap closure. The following were reviewed: abstraction for care gap closure-kidney health evaluation for diabetes:eGFR  and uACR.    [facility] Quality Team

## 2024-11-16 ENCOUNTER — Encounter: Payer: Self-pay | Admitting: Gastroenterology

## 2024-11-20 ENCOUNTER — Ambulatory Visit

## 2024-12-17 ENCOUNTER — Ambulatory Visit: Admitting: Nurse Practitioner

## 2025-02-26 ENCOUNTER — Ambulatory Visit: Admitting: Adult Health
